# Patient Record
Sex: Female | Born: 1970 | Race: White | Hispanic: No | Marital: Married | Smoking: Current every day smoker
Health system: Southern US, Community
[De-identification: ages and names within clinical notes are randomized; demographics above are authoritative.]

## PROBLEM LIST (undated history)

## (undated) DIAGNOSIS — E119 Type 2 diabetes mellitus without complications: Secondary | ICD-10-CM

## (undated) DIAGNOSIS — I10 Essential (primary) hypertension: Secondary | ICD-10-CM

## (undated) DIAGNOSIS — K589 Irritable bowel syndrome without diarrhea: Secondary | ICD-10-CM

## (undated) DIAGNOSIS — Z85048 Personal history of other malignant neoplasm of rectum, rectosigmoid junction, and anus: Secondary | ICD-10-CM

## (undated) HISTORY — DX: Irritable bowel syndrome, unspecified: K58.9

## (undated) HISTORY — PX: UPPER GASTROINTESTINAL ENDOSCOPY: SHX188

## (undated) HISTORY — DX: Type 2 diabetes mellitus without complications: E11.9

## (undated) HISTORY — DX: Personal history of other malignant neoplasm of rectum, rectosigmoid junction, and anus: Z85.048

---

## 1996-04-08 DIAGNOSIS — E119 Type 2 diabetes mellitus without complications: Secondary | ICD-10-CM

## 1996-04-08 HISTORY — DX: Type 2 diabetes mellitus without complications: E11.9

## 2007-04-09 DIAGNOSIS — C20 Malignant neoplasm of rectum: Secondary | ICD-10-CM

## 2007-04-09 HISTORY — DX: Malignant neoplasm of rectum: C20

## 2008-05-09 HISTORY — PX: OTHER SURGICAL HISTORY: SHX169

## 2015-07-18 DIAGNOSIS — J159 Unspecified bacterial pneumonia: Secondary | ICD-10-CM | POA: Diagnosis not present

## 2015-07-26 DIAGNOSIS — M5090 Cervical disc disorder, unspecified, unspecified cervical region: Secondary | ICD-10-CM | POA: Diagnosis not present

## 2015-08-08 DIAGNOSIS — Z932 Ileostomy status: Secondary | ICD-10-CM | POA: Diagnosis not present

## 2015-08-08 DIAGNOSIS — E109 Type 1 diabetes mellitus without complications: Secondary | ICD-10-CM | POA: Diagnosis not present

## 2015-08-08 DIAGNOSIS — Z794 Long term (current) use of insulin: Secondary | ICD-10-CM | POA: Diagnosis not present

## 2015-11-10 DIAGNOSIS — E1065 Type 1 diabetes mellitus with hyperglycemia: Secondary | ICD-10-CM | POA: Diagnosis not present

## 2015-11-10 DIAGNOSIS — G629 Polyneuropathy, unspecified: Secondary | ICD-10-CM | POA: Diagnosis not present

## 2015-11-10 DIAGNOSIS — Z85038 Personal history of other malignant neoplasm of large intestine: Secondary | ICD-10-CM | POA: Diagnosis not present

## 2016-01-02 DIAGNOSIS — Z85048 Personal history of other malignant neoplasm of rectum, rectosigmoid junction, and anus: Secondary | ICD-10-CM | POA: Diagnosis not present

## 2016-01-02 DIAGNOSIS — K581 Irritable bowel syndrome with constipation: Secondary | ICD-10-CM | POA: Diagnosis not present

## 2016-02-07 DIAGNOSIS — Z932 Ileostomy status: Secondary | ICD-10-CM | POA: Diagnosis not present

## 2016-02-07 DIAGNOSIS — Z794 Long term (current) use of insulin: Secondary | ICD-10-CM | POA: Diagnosis not present

## 2016-02-07 DIAGNOSIS — E109 Type 1 diabetes mellitus without complications: Secondary | ICD-10-CM | POA: Diagnosis not present

## 2016-02-12 DIAGNOSIS — E119 Type 2 diabetes mellitus without complications: Secondary | ICD-10-CM | POA: Diagnosis not present

## 2016-02-12 DIAGNOSIS — Z85048 Personal history of other malignant neoplasm of rectum, rectosigmoid junction, and anus: Secondary | ICD-10-CM | POA: Diagnosis not present

## 2016-02-12 DIAGNOSIS — F17210 Nicotine dependence, cigarettes, uncomplicated: Secondary | ICD-10-CM | POA: Diagnosis not present

## 2016-02-12 DIAGNOSIS — Z980 Intestinal bypass and anastomosis status: Secondary | ICD-10-CM | POA: Diagnosis not present

## 2016-02-12 DIAGNOSIS — Z794 Long term (current) use of insulin: Secondary | ICD-10-CM | POA: Diagnosis not present

## 2016-02-12 DIAGNOSIS — Z1211 Encounter for screening for malignant neoplasm of colon: Secondary | ICD-10-CM | POA: Diagnosis not present

## 2016-02-13 DIAGNOSIS — E119 Type 2 diabetes mellitus without complications: Secondary | ICD-10-CM | POA: Diagnosis not present

## 2016-02-13 DIAGNOSIS — Z980 Intestinal bypass and anastomosis status: Secondary | ICD-10-CM | POA: Diagnosis not present

## 2016-02-13 DIAGNOSIS — F17210 Nicotine dependence, cigarettes, uncomplicated: Secondary | ICD-10-CM | POA: Diagnosis not present

## 2016-02-13 DIAGNOSIS — K581 Irritable bowel syndrome with constipation: Secondary | ICD-10-CM | POA: Diagnosis not present

## 2016-02-13 DIAGNOSIS — Z85048 Personal history of other malignant neoplasm of rectum, rectosigmoid junction, and anus: Secondary | ICD-10-CM | POA: Diagnosis not present

## 2016-02-13 DIAGNOSIS — Z794 Long term (current) use of insulin: Secondary | ICD-10-CM | POA: Diagnosis not present

## 2016-02-13 DIAGNOSIS — K9189 Other postprocedural complications and disorders of digestive system: Secondary | ICD-10-CM | POA: Diagnosis not present

## 2016-02-13 DIAGNOSIS — Z1211 Encounter for screening for malignant neoplasm of colon: Secondary | ICD-10-CM | POA: Diagnosis not present

## 2016-02-13 HISTORY — PX: COLONOSCOPY: SHX174

## 2016-04-09 DIAGNOSIS — B9689 Other specified bacterial agents as the cause of diseases classified elsewhere: Secondary | ICD-10-CM | POA: Diagnosis not present

## 2016-04-09 DIAGNOSIS — Z720 Tobacco use: Secondary | ICD-10-CM | POA: Diagnosis not present

## 2016-04-09 DIAGNOSIS — J208 Acute bronchitis due to other specified organisms: Secondary | ICD-10-CM | POA: Diagnosis not present

## 2016-07-26 DIAGNOSIS — E109 Type 1 diabetes mellitus without complications: Secondary | ICD-10-CM | POA: Diagnosis not present

## 2016-07-26 DIAGNOSIS — Z932 Ileostomy status: Secondary | ICD-10-CM | POA: Diagnosis not present

## 2016-07-26 DIAGNOSIS — Z794 Long term (current) use of insulin: Secondary | ICD-10-CM | POA: Diagnosis not present

## 2016-10-10 DIAGNOSIS — Z1389 Encounter for screening for other disorder: Secondary | ICD-10-CM | POA: Diagnosis not present

## 2016-10-10 DIAGNOSIS — F17200 Nicotine dependence, unspecified, uncomplicated: Secondary | ICD-10-CM | POA: Diagnosis not present

## 2016-10-10 DIAGNOSIS — E1065 Type 1 diabetes mellitus with hyperglycemia: Secondary | ICD-10-CM | POA: Diagnosis not present

## 2016-10-10 DIAGNOSIS — Z6822 Body mass index (BMI) 22.0-22.9, adult: Secondary | ICD-10-CM | POA: Diagnosis not present

## 2017-02-14 DIAGNOSIS — E109 Type 1 diabetes mellitus without complications: Secondary | ICD-10-CM | POA: Diagnosis not present

## 2017-02-14 DIAGNOSIS — Z932 Ileostomy status: Secondary | ICD-10-CM | POA: Diagnosis not present

## 2017-02-14 DIAGNOSIS — Z794 Long term (current) use of insulin: Secondary | ICD-10-CM | POA: Diagnosis not present

## 2017-04-12 DIAGNOSIS — J069 Acute upper respiratory infection, unspecified: Secondary | ICD-10-CM | POA: Diagnosis not present

## 2017-04-12 DIAGNOSIS — R0602 Shortness of breath: Secondary | ICD-10-CM | POA: Diagnosis not present

## 2017-04-12 DIAGNOSIS — R05 Cough: Secondary | ICD-10-CM | POA: Diagnosis not present

## 2017-04-12 DIAGNOSIS — J3489 Other specified disorders of nose and nasal sinuses: Secondary | ICD-10-CM | POA: Diagnosis not present

## 2017-04-12 DIAGNOSIS — F17210 Nicotine dependence, cigarettes, uncomplicated: Secondary | ICD-10-CM | POA: Diagnosis not present

## 2017-04-12 DIAGNOSIS — R0789 Other chest pain: Secondary | ICD-10-CM | POA: Diagnosis not present

## 2017-04-15 DIAGNOSIS — J0110 Acute frontal sinusitis, unspecified: Secondary | ICD-10-CM | POA: Diagnosis not present

## 2017-04-15 DIAGNOSIS — J209 Acute bronchitis, unspecified: Secondary | ICD-10-CM | POA: Diagnosis not present

## 2017-04-15 DIAGNOSIS — R6889 Other general symptoms and signs: Secondary | ICD-10-CM | POA: Diagnosis not present

## 2017-04-15 DIAGNOSIS — Z720 Tobacco use: Secondary | ICD-10-CM | POA: Diagnosis not present

## 2017-05-23 DIAGNOSIS — E1065 Type 1 diabetes mellitus with hyperglycemia: Secondary | ICD-10-CM | POA: Diagnosis not present

## 2017-05-23 DIAGNOSIS — Z720 Tobacco use: Secondary | ICD-10-CM | POA: Diagnosis not present

## 2017-05-23 DIAGNOSIS — Z23 Encounter for immunization: Secondary | ICD-10-CM | POA: Diagnosis not present

## 2017-05-23 DIAGNOSIS — Z6823 Body mass index (BMI) 23.0-23.9, adult: Secondary | ICD-10-CM | POA: Diagnosis not present

## 2017-07-02 DIAGNOSIS — J0100 Acute maxillary sinusitis, unspecified: Secondary | ICD-10-CM | POA: Diagnosis not present

## 2017-07-02 DIAGNOSIS — G43709 Chronic migraine without aura, not intractable, without status migrainosus: Secondary | ICD-10-CM | POA: Diagnosis not present

## 2017-07-02 DIAGNOSIS — Z720 Tobacco use: Secondary | ICD-10-CM | POA: Diagnosis not present

## 2017-07-02 DIAGNOSIS — Z6824 Body mass index (BMI) 24.0-24.9, adult: Secondary | ICD-10-CM | POA: Diagnosis not present

## 2017-08-04 DIAGNOSIS — J329 Chronic sinusitis, unspecified: Secondary | ICD-10-CM | POA: Diagnosis not present

## 2017-08-04 DIAGNOSIS — J40 Bronchitis, not specified as acute or chronic: Secondary | ICD-10-CM | POA: Diagnosis not present

## 2017-08-20 DIAGNOSIS — E109 Type 1 diabetes mellitus without complications: Secondary | ICD-10-CM | POA: Diagnosis not present

## 2017-08-20 DIAGNOSIS — Z794 Long term (current) use of insulin: Secondary | ICD-10-CM | POA: Diagnosis not present

## 2017-08-20 DIAGNOSIS — Z932 Ileostomy status: Secondary | ICD-10-CM | POA: Diagnosis not present

## 2017-11-11 DIAGNOSIS — E109 Type 1 diabetes mellitus without complications: Secondary | ICD-10-CM | POA: Diagnosis not present

## 2017-11-11 DIAGNOSIS — Z794 Long term (current) use of insulin: Secondary | ICD-10-CM | POA: Diagnosis not present

## 2017-11-11 DIAGNOSIS — Z932 Ileostomy status: Secondary | ICD-10-CM | POA: Diagnosis not present

## 2017-11-13 DIAGNOSIS — Z932 Ileostomy status: Secondary | ICD-10-CM | POA: Diagnosis not present

## 2017-11-13 DIAGNOSIS — Z794 Long term (current) use of insulin: Secondary | ICD-10-CM | POA: Diagnosis not present

## 2017-11-13 DIAGNOSIS — E109 Type 1 diabetes mellitus without complications: Secondary | ICD-10-CM | POA: Diagnosis not present

## 2017-12-02 DIAGNOSIS — E109 Type 1 diabetes mellitus without complications: Secondary | ICD-10-CM | POA: Diagnosis not present

## 2017-12-02 DIAGNOSIS — Z794 Long term (current) use of insulin: Secondary | ICD-10-CM | POA: Diagnosis not present

## 2017-12-02 DIAGNOSIS — Z932 Ileostomy status: Secondary | ICD-10-CM | POA: Diagnosis not present

## 2018-01-19 DIAGNOSIS — Z1331 Encounter for screening for depression: Secondary | ICD-10-CM | POA: Diagnosis not present

## 2018-01-19 DIAGNOSIS — F332 Major depressive disorder, recurrent severe without psychotic features: Secondary | ICD-10-CM | POA: Diagnosis not present

## 2018-01-19 DIAGNOSIS — Z1339 Encounter for screening examination for other mental health and behavioral disorders: Secondary | ICD-10-CM | POA: Diagnosis not present

## 2018-01-19 DIAGNOSIS — E119 Type 2 diabetes mellitus without complications: Secondary | ICD-10-CM | POA: Diagnosis not present

## 2018-01-19 DIAGNOSIS — Z23 Encounter for immunization: Secondary | ICD-10-CM | POA: Diagnosis not present

## 2018-02-13 DIAGNOSIS — Z794 Long term (current) use of insulin: Secondary | ICD-10-CM | POA: Diagnosis not present

## 2018-02-13 DIAGNOSIS — Z932 Ileostomy status: Secondary | ICD-10-CM | POA: Diagnosis not present

## 2018-02-13 DIAGNOSIS — E109 Type 1 diabetes mellitus without complications: Secondary | ICD-10-CM | POA: Diagnosis not present

## 2018-05-18 DIAGNOSIS — Z794 Long term (current) use of insulin: Secondary | ICD-10-CM | POA: Diagnosis not present

## 2018-05-18 DIAGNOSIS — E109 Type 1 diabetes mellitus without complications: Secondary | ICD-10-CM | POA: Diagnosis not present

## 2018-05-18 DIAGNOSIS — Z932 Ileostomy status: Secondary | ICD-10-CM | POA: Diagnosis not present

## 2018-07-01 DIAGNOSIS — E1065 Type 1 diabetes mellitus with hyperglycemia: Secondary | ICD-10-CM | POA: Diagnosis not present

## 2018-07-01 DIAGNOSIS — Z6823 Body mass index (BMI) 23.0-23.9, adult: Secondary | ICD-10-CM | POA: Diagnosis not present

## 2018-07-01 DIAGNOSIS — G4700 Insomnia, unspecified: Secondary | ICD-10-CM | POA: Diagnosis not present

## 2018-08-18 DIAGNOSIS — E1065 Type 1 diabetes mellitus with hyperglycemia: Secondary | ICD-10-CM | POA: Diagnosis not present

## 2018-09-22 DIAGNOSIS — E109 Type 1 diabetes mellitus without complications: Secondary | ICD-10-CM | POA: Diagnosis not present

## 2018-11-10 DIAGNOSIS — E1065 Type 1 diabetes mellitus with hyperglycemia: Secondary | ICD-10-CM | POA: Diagnosis not present

## 2019-01-20 DIAGNOSIS — Z23 Encounter for immunization: Secondary | ICD-10-CM | POA: Diagnosis not present

## 2019-02-02 DIAGNOSIS — E1065 Type 1 diabetes mellitus with hyperglycemia: Secondary | ICD-10-CM | POA: Diagnosis not present

## 2019-02-17 DIAGNOSIS — E109 Type 1 diabetes mellitus without complications: Secondary | ICD-10-CM | POA: Diagnosis not present

## 2019-02-17 DIAGNOSIS — Z794 Long term (current) use of insulin: Secondary | ICD-10-CM | POA: Diagnosis not present

## 2019-02-17 DIAGNOSIS — Z932 Ileostomy status: Secondary | ICD-10-CM | POA: Diagnosis not present

## 2019-03-29 ENCOUNTER — Encounter: Payer: Self-pay | Admitting: Gastroenterology

## 2019-04-27 DIAGNOSIS — E1065 Type 1 diabetes mellitus with hyperglycemia: Secondary | ICD-10-CM | POA: Diagnosis not present

## 2019-06-30 DIAGNOSIS — Z6823 Body mass index (BMI) 23.0-23.9, adult: Secondary | ICD-10-CM | POA: Diagnosis not present

## 2019-06-30 DIAGNOSIS — G4700 Insomnia, unspecified: Secondary | ICD-10-CM | POA: Diagnosis not present

## 2019-06-30 DIAGNOSIS — G43909 Migraine, unspecified, not intractable, without status migrainosus: Secondary | ICD-10-CM | POA: Diagnosis not present

## 2019-06-30 DIAGNOSIS — E1065 Type 1 diabetes mellitus with hyperglycemia: Secondary | ICD-10-CM | POA: Diagnosis not present

## 2019-08-09 DIAGNOSIS — E1065 Type 1 diabetes mellitus with hyperglycemia: Secondary | ICD-10-CM | POA: Diagnosis not present

## 2019-08-18 DIAGNOSIS — Z932 Ileostomy status: Secondary | ICD-10-CM | POA: Diagnosis not present

## 2019-08-18 DIAGNOSIS — Z794 Long term (current) use of insulin: Secondary | ICD-10-CM | POA: Diagnosis not present

## 2019-08-18 DIAGNOSIS — E109 Type 1 diabetes mellitus without complications: Secondary | ICD-10-CM | POA: Diagnosis not present

## 2019-11-08 DIAGNOSIS — E1065 Type 1 diabetes mellitus with hyperglycemia: Secondary | ICD-10-CM | POA: Diagnosis not present

## 2019-11-23 DIAGNOSIS — Z932 Ileostomy status: Secondary | ICD-10-CM | POA: Diagnosis not present

## 2019-11-23 DIAGNOSIS — E109 Type 1 diabetes mellitus without complications: Secondary | ICD-10-CM | POA: Diagnosis not present

## 2019-11-23 DIAGNOSIS — Z794 Long term (current) use of insulin: Secondary | ICD-10-CM | POA: Diagnosis not present

## 2020-01-20 DIAGNOSIS — Z6824 Body mass index (BMI) 24.0-24.9, adult: Secondary | ICD-10-CM | POA: Diagnosis not present

## 2020-01-20 DIAGNOSIS — G43909 Migraine, unspecified, not intractable, without status migrainosus: Secondary | ICD-10-CM | POA: Diagnosis not present

## 2020-01-20 DIAGNOSIS — Z23 Encounter for immunization: Secondary | ICD-10-CM | POA: Diagnosis not present

## 2020-01-20 DIAGNOSIS — E1065 Type 1 diabetes mellitus with hyperglycemia: Secondary | ICD-10-CM | POA: Diagnosis not present

## 2020-01-25 DIAGNOSIS — Z794 Long term (current) use of insulin: Secondary | ICD-10-CM | POA: Diagnosis not present

## 2020-01-25 DIAGNOSIS — E109 Type 1 diabetes mellitus without complications: Secondary | ICD-10-CM | POA: Diagnosis not present

## 2020-01-31 DIAGNOSIS — E1065 Type 1 diabetes mellitus with hyperglycemia: Secondary | ICD-10-CM | POA: Diagnosis not present

## 2020-04-24 DIAGNOSIS — E1065 Type 1 diabetes mellitus with hyperglycemia: Secondary | ICD-10-CM | POA: Diagnosis not present

## 2020-05-24 DIAGNOSIS — E109 Type 1 diabetes mellitus without complications: Secondary | ICD-10-CM | POA: Diagnosis not present

## 2020-05-24 DIAGNOSIS — Z794 Long term (current) use of insulin: Secondary | ICD-10-CM | POA: Diagnosis not present

## 2020-05-24 DIAGNOSIS — Z932 Ileostomy status: Secondary | ICD-10-CM | POA: Diagnosis not present

## 2020-07-06 DIAGNOSIS — Z6825 Body mass index (BMI) 25.0-25.9, adult: Secondary | ICD-10-CM | POA: Diagnosis not present

## 2020-07-06 DIAGNOSIS — K589 Irritable bowel syndrome without diarrhea: Secondary | ICD-10-CM | POA: Diagnosis not present

## 2020-07-06 DIAGNOSIS — K625 Hemorrhage of anus and rectum: Secondary | ICD-10-CM | POA: Diagnosis not present

## 2020-07-17 DIAGNOSIS — E1065 Type 1 diabetes mellitus with hyperglycemia: Secondary | ICD-10-CM | POA: Diagnosis not present

## 2020-07-26 DIAGNOSIS — Z6825 Body mass index (BMI) 25.0-25.9, adult: Secondary | ICD-10-CM | POA: Diagnosis not present

## 2020-07-31 ENCOUNTER — Ambulatory Visit (INDEPENDENT_AMBULATORY_CARE_PROVIDER_SITE_OTHER): Payer: BC Managed Care – PPO | Admitting: Gastroenterology

## 2020-07-31 ENCOUNTER — Encounter: Payer: Self-pay | Admitting: Gastroenterology

## 2020-07-31 ENCOUNTER — Other Ambulatory Visit (INDEPENDENT_AMBULATORY_CARE_PROVIDER_SITE_OTHER): Payer: BC Managed Care – PPO

## 2020-07-31 ENCOUNTER — Other Ambulatory Visit: Payer: Self-pay

## 2020-07-31 VITALS — BP 122/74 | HR 88 | Ht 61.0 in | Wt 142.0 lb

## 2020-07-31 DIAGNOSIS — Z85048 Personal history of other malignant neoplasm of rectum, rectosigmoid junction, and anus: Secondary | ICD-10-CM | POA: Diagnosis not present

## 2020-07-31 DIAGNOSIS — K625 Hemorrhage of anus and rectum: Secondary | ICD-10-CM | POA: Diagnosis not present

## 2020-07-31 DIAGNOSIS — K5900 Constipation, unspecified: Secondary | ICD-10-CM | POA: Diagnosis not present

## 2020-07-31 LAB — CBC WITH DIFFERENTIAL/PLATELET
Basophils Absolute: 0.1 10*3/uL (ref 0.0–0.1)
Basophils Relative: 0.9 % (ref 0.0–3.0)
Eosinophils Absolute: 0.2 10*3/uL (ref 0.0–0.7)
Eosinophils Relative: 2.5 % (ref 0.0–5.0)
HCT: 46.4 % — ABNORMAL HIGH (ref 36.0–46.0)
Hemoglobin: 15.8 g/dL — ABNORMAL HIGH (ref 12.0–15.0)
Lymphocytes Relative: 20.5 % (ref 12.0–46.0)
Lymphs Abs: 1.3 10*3/uL (ref 0.7–4.0)
MCHC: 34.1 g/dL (ref 30.0–36.0)
MCV: 92.6 fl (ref 78.0–100.0)
Monocytes Absolute: 0.4 10*3/uL (ref 0.1–1.0)
Monocytes Relative: 6.3 % (ref 3.0–12.0)
Neutro Abs: 4.6 10*3/uL (ref 1.4–7.7)
Neutrophils Relative %: 69.8 % (ref 43.0–77.0)
Platelets: 197 10*3/uL (ref 150.0–400.0)
RBC: 5.01 Mil/uL (ref 3.87–5.11)
RDW: 12.6 % (ref 11.5–15.5)
WBC: 6.5 10*3/uL (ref 4.0–10.5)

## 2020-07-31 LAB — COMPREHENSIVE METABOLIC PANEL
ALT: 19 U/L (ref 0–35)
AST: 20 U/L (ref 0–37)
Albumin: 4 g/dL (ref 3.5–5.2)
Alkaline Phosphatase: 118 U/L — ABNORMAL HIGH (ref 39–117)
BUN: 9 mg/dL (ref 6–23)
CO2: 28 mEq/L (ref 19–32)
Calcium: 9.2 mg/dL (ref 8.4–10.5)
Chloride: 104 mEq/L (ref 96–112)
Creatinine, Ser: 0.77 mg/dL (ref 0.40–1.20)
GFR: 89.49 mL/min (ref >60.00)
Glucose, Bld: 152 mg/dL — ABNORMAL HIGH (ref 70–99)
Potassium: 4.2 mEq/L (ref 3.5–5.1)
Sodium: 137 mEq/L (ref 135–145)
Total Bilirubin: 0.4 mg/dL (ref 0.2–1.2)
Total Protein: 6.5 g/dL (ref 6.0–8.3)

## 2020-07-31 LAB — TSH: TSH: 1.7 u[IU]/mL (ref 0.35–4.50)

## 2020-07-31 MED ORDER — SUTAB 1479-225-188 MG PO TABS: 1.0000 | ORAL_TABLET | ORAL | 0 refills | Status: DC

## 2020-07-31 NOTE — Progress Notes (Signed)
Chief Complaint:   Referring Provider:  Jackquline Denmark, MD      ASSESSMENT AND PLAN;   #1. Rectal bleeding  #2. IBS-C  #3. H/O Rectal Ca (T3N0M0, IIA) s/p neoadjuvant chemo XRT followed by LAR, wedge resection of segment 4 liver nodule (Bx- [facility]) with protective loop ileostomy 05/2008 followed by takedown of loop ileostomy 11/2008. Neg CT chest/Abdo/pelvis 2015  Plan: -CBC, CMP, CEA, TSH, celiac serology -CT AP with p.o. and IV contrast. -Colon with sutab for further evaluation -Colace 1 tab po qd. -Increase water intake. -Discussed in detail with the patient and patient's husband.    Discussed risks & benefits. Risks including rare perforation req laparotomy, bleeding after bx/polypectomy req blood transfusion, rarely missing neoplasms, risks of anesthesia/sedation, rare risk of splenic trauma. Benefits outweigh the risks. Patient agrees to proceed. All the questions were answered. Consent forms given for review.    HPI:    Rhonda Villa is a 50 y.o. female  With IDDM, anxiety/depression, history of smoking, H/O rectal Ca as above Accompanied by her husband  Known to [place] from previous practice.  With intermittent bright red blood per rectum with associated constipation, pellet-like stools.  She also has postprandial abdominal bloating.  She denies having any nausea, vomiting, heartburn, regurgitation, odynophagia or dysphagia.  She denies having any abdominal pain.  No fever chills or night sweats.  No recent weight loss.  Past GI work-up Colonoscopy 02/2016 ([facility]): Anastomosis was visualized 4 cm above the dentate line.  Some degree of anastomotic stenosis.  Pediatric colonoscopy was passed with ease beyond.  Otherwise normal to TI.  Past Medical History:  Diagnosis Date  . Diabetes mellitus ([place])    type 1 on insulin pump  . History of rectal cancer   . IBS (irritable bowel syndrome)     Family History  Problem Relation Age of Onset  . Multiple sclerosis Mother    . Heart failure Father     Social History   Tobacco Use  . Smoking status: Current Every Day Smoker    Types: Cigarettes  . Smokeless tobacco: Never Used  Substance Use Topics  . Alcohol use: Yes    Comment: occasional  . Drug use: Not Currently    Current Outpatient Medications  Medication Sig Dispense Refill  . EMGALITY 120 MG/ML SOAJ Inject 120 mg into the skin once.    . insulin aspart (NOVOLOG) 100 UNIT/ML injection Inject 50 Units into the skin 3 (three) times daily before meals. Via insullin pump    . traZODone (DESYREL) 100 MG tablet Take 200 mg by mouth at bedtime.     No current facility-administered medications for this visit.    No Known Allergies  Review of Systems:  Constitutional: Denies fever, chills, diaphoresis, appetite change and fatigue.  HEENT: Denies photophobia, eye pain, redness, hearing loss, ear pain, congestion, sore throat, rhinorrhea, sneezing, mouth sores, neck pain, neck stiffness and tinnitus.   Respiratory: Denies SOB, DOE, cough, chest tightness,  and wheezing.   Cardiovascular: Denies chest pain, palpitations and leg swelling.  Genitourinary: Denies dysuria, urgency, frequency, hematuria, flank pain and difficulty urinating.  Musculoskeletal: Denies myalgias, back pain, joint swelling, arthralgias and gait problem.  Skin: No rash.  Neurological: Denies dizziness, seizures, syncope, weakness, light-headedness, numbness and headaches.  Hematological: Denies adenopathy. Easy bruising, personal or family bleeding history  Psychiatric/Behavioral: Has anxiety or depression     Physical Exam:    BP 122/74 (BP Location: Left Arm, Patient Position: Sitting, Cuff  Size: Normal)   Pulse 88   Ht 5\' 1"  (1.549 m)   Wt 142 lb (64.4 kg)   BMI 26.83 kg/m  Wt Readings from Last 3 Encounters:  07/31/20 142 lb (64.4 kg)   Constitutional:  Well-developed, in no acute distress. Psychiatric: Normal mood and affect. Behavior is normal. HEENT: Pupils  normal.  Conjunctivae are normal. No scleral icterus. Cardiovascular: Normal rate, regular rhythm. No edema Pulmonary/chest: Effort normal and breath sounds normal. No wheezing, rales or rhonchi. Abdominal: Soft, nondistended. Nontender. Bowel sounds active throughout. There are no masses palpable. No hepatomegaly. Rectal: Deferred Neurological: Alert and oriented to person place and time. Skin: Skin is warm and dry. No rashes noted.    Carmell Austria, MD 07/31/2020, 11:10 AM  Cc: Jackquline Denmark, MD

## 2020-07-31 NOTE — Patient Instructions (Signed)
.  If you are age 51 or older, your body mass index should be between 23-30. Your Body mass index is 26.83 kg/m. If this is out of the aforementioned range listed, please consider follow up with your Primary Care Provider.  If you are age 76 or younger, your body mass index should be between 19-25. Your Body mass index is 26.83 kg/m. If this is out of the aformentioned range listed, please consider follow up with your Primary Care Provider.   Please go to the lab on the 2nd floor [address] before you leave the office today.   You have been [facility] CT [facility] [place] [id number] [address] [facility]   You are scheduled on May 4th  At 930am  . You should arrive 15 minutes prior to your appointment time for registration. Please follow the written instructions below on the day of your exam:  WARNING: IF YOU ARE ALLERGIC TO IODINE/X-RAY DYE, PLEASE NOTIFY RADIOLOGY IMMEDIATELY AT [phone]! YOU WILL BE GIVEN A 13 HOUR PREMEDICATION PREP.  1) Do not eat or drink anything after  530am (4 hours prior to your test) 2) You have been given 2 bottles of oral contrast to drink. The solution may taste better if refrigerated, but do NOT add ice or any other liquid to this solution. Shake well before drinking.    Drink 1 bottle of contrast @ 730am (2 hours prior to your exam)  Drink 1 bottle of contrast @ 830am (1 hour prior to your exam)  You may take any medications as prescribed with a small amount of water, if necessary. If you take any of the following medications: METFORMIN, GLUCOPHAGE, GLUCOVANCE, AVANDAMET, RIOMET, FORTAMET, [place] MET, JANUMET, GLUMETZA or METAGLIP, you MAY be asked to HOLD this medication 48 hours AFTER the exam.  The purpose of you drinking the oral contrast is to aid in the visualization of your intestinal tract. The contrast solution may cause some diarrhea. Depending on your individual set of  symptoms, you may also receive an intravenous injection of x-ray contrast/dye. Plan on being at [facility] for 30 minutes or longer, depending on the type of exam you are having performed.  This test typically takes 30-45 minutes to complete.  If you have any questions regarding your exam or if you need to reschedule, you may call the CT department at [phone] between the hours of 8:00 am and 5:00 pm, Monday-Friday.  ________________________________________________________________________

## 2020-08-01 LAB — CEA: CEA: 5.6 ng/mL — ABNORMAL HIGH

## 2020-08-02 ENCOUNTER — Other Ambulatory Visit: Payer: Self-pay

## 2020-08-02 DIAGNOSIS — R768 Other specified abnormal immunological findings in serum: Secondary | ICD-10-CM

## 2020-08-02 LAB — CELIAC PANEL 10
Antigliadin Abs, IgA: 101 units — ABNORMAL HIGH (ref 0–19)
Endomysial IgA: POSITIVE — AB
Gliadin IgG: 45 units — ABNORMAL HIGH (ref 0–19)
IgA/Immunoglobulin A, Serum: 107 mg/dL (ref 87–352)
Tissue Transglut Ab: <2 U/mL (ref 0–5)
Transglutaminase IgA: 27 U/mL — ABNORMAL HIGH (ref 0–3)

## 2020-08-02 NOTE — Progress Notes (Signed)
Please inform the patient. Fascinating-she has positive celiac screen and likely has celiac disease in addition.  Also has mildly elevated CEA  Vaughan Basta, -Add EGD with small bowel biopsies.  To be done with colonoscopy. -Send report to family physician -She is scheduled for CT Abdo/pelvis already

## 2020-08-09 ENCOUNTER — Ambulatory Visit (HOSPITAL_BASED_OUTPATIENT_CLINIC_OR_DEPARTMENT_OTHER)
Admission: RE | Admit: 2020-08-09 | Discharge: 2020-08-09 | Disposition: A | Discharge status: Home / Self Care | Payer: BC Managed Care – PPO | Source: Ambulatory Visit | Attending: Gastroenterology | Admitting: Gastroenterology

## 2020-08-09 ENCOUNTER — Encounter (HOSPITAL_BASED_OUTPATIENT_CLINIC_OR_DEPARTMENT_OTHER): Payer: Self-pay

## 2020-08-09 ENCOUNTER — Other Ambulatory Visit: Payer: Self-pay

## 2020-08-09 DIAGNOSIS — K6389 Other specified diseases of intestine: Secondary | ICD-10-CM | POA: Diagnosis not present

## 2020-08-09 DIAGNOSIS — C20 Malignant neoplasm of rectum: Secondary | ICD-10-CM | POA: Diagnosis not present

## 2020-08-09 DIAGNOSIS — K5900 Constipation, unspecified: Secondary | ICD-10-CM | POA: Diagnosis not present

## 2020-08-09 DIAGNOSIS — Z85048 Personal history of other malignant neoplasm of rectum, rectosigmoid junction, and anus: Secondary | ICD-10-CM | POA: Insufficient documentation

## 2020-08-09 DIAGNOSIS — K625 Hemorrhage of anus and rectum: Secondary | ICD-10-CM | POA: Diagnosis not present

## 2020-08-09 DIAGNOSIS — K7689 Other specified diseases of liver: Secondary | ICD-10-CM | POA: Diagnosis not present

## 2020-08-09 MED ORDER — IOHEXOL 300 MG/ML  SOLN
100.0000 mL | Freq: Once | INTRAMUSCULAR | Status: AC | PRN
  Administered 2020-08-09: 100 mL via INTRAVENOUS

## 2020-08-11 ENCOUNTER — Encounter: Payer: Self-pay | Admitting: Gastroenterology

## 2020-08-17 ENCOUNTER — Other Ambulatory Visit: Payer: Self-pay

## 2020-08-17 ENCOUNTER — Encounter: Payer: Self-pay | Admitting: Gastroenterology

## 2020-08-17 ENCOUNTER — Ambulatory Visit (AMBULATORY_SURGERY_CENTER): Payer: BC Managed Care – PPO | Admitting: Gastroenterology

## 2020-08-17 VITALS — BP 122/64 | HR 79 | Temp 97.8°F | Resp 14 | Ht 61.0 in | Wt 142.0 lb

## 2020-08-17 DIAGNOSIS — K922 Gastrointestinal hemorrhage, unspecified: Secondary | ICD-10-CM | POA: Diagnosis not present

## 2020-08-17 DIAGNOSIS — D123 Benign neoplasm of transverse colon: Secondary | ICD-10-CM

## 2020-08-17 DIAGNOSIS — R768 Other specified abnormal immunological findings in serum: Secondary | ICD-10-CM | POA: Diagnosis not present

## 2020-08-17 DIAGNOSIS — K648 Other hemorrhoids: Secondary | ICD-10-CM | POA: Diagnosis not present

## 2020-08-17 DIAGNOSIS — K900 Celiac disease: Secondary | ICD-10-CM

## 2020-08-17 DIAGNOSIS — K582 Mixed irritable bowel syndrome: Secondary | ICD-10-CM | POA: Diagnosis not present

## 2020-08-17 DIAGNOSIS — K625 Hemorrhage of anus and rectum: Secondary | ICD-10-CM | POA: Diagnosis not present

## 2020-08-17 DIAGNOSIS — K319 Disease of stomach and duodenum, unspecified: Secondary | ICD-10-CM

## 2020-08-17 DIAGNOSIS — D122 Benign neoplasm of ascending colon: Secondary | ICD-10-CM

## 2020-08-17 DIAGNOSIS — K588 Other irritable bowel syndrome: Secondary | ICD-10-CM

## 2020-08-17 DIAGNOSIS — K5730 Diverticulosis of large intestine without perforation or abscess without bleeding: Secondary | ICD-10-CM | POA: Diagnosis not present

## 2020-08-17 DIAGNOSIS — K449 Diaphragmatic hernia without obstruction or gangrene: Secondary | ICD-10-CM

## 2020-08-17 DIAGNOSIS — K5900 Constipation, unspecified: Secondary | ICD-10-CM

## 2020-08-17 DIAGNOSIS — R197 Diarrhea, unspecified: Secondary | ICD-10-CM

## 2020-08-17 DIAGNOSIS — Z85048 Personal history of other malignant neoplasm of rectum, rectosigmoid junction, and anus: Secondary | ICD-10-CM

## 2020-08-17 DIAGNOSIS — K2970 Gastritis, unspecified, without bleeding: Secondary | ICD-10-CM | POA: Diagnosis not present

## 2020-08-17 HISTORY — PX: COLONOSCOPY: SHX174

## 2020-08-17 MED ORDER — SODIUM CHLORIDE 0.9 % IV SOLN
4.0000 mg | Freq: Once | INTRAVENOUS | Status: AC
  Administered 2020-08-17: 4 mg via INTRAVENOUS

## 2020-08-17 MED ORDER — SODIUM CHLORIDE 0.9 % IV SOLN: 500.0000 mL | Freq: Once | INTRAVENOUS | Status: DC

## 2020-08-17 MED ORDER — PANTOPRAZOLE SODIUM 40 MG PO TBEC: 40.0000 mg | DELAYED_RELEASE_TABLET | Freq: Every day | ORAL | 11 refills | Status: DC

## 2020-08-17 NOTE — Progress Notes (Signed)
Pt's states no medical or surgical changes since previsit or office visit.  [facility] [place]   Pt c/o nausea, vomited with sutabs this am, zofran 4 mg IVP given at 1040 am

## 2020-08-17 NOTE — Op Note (Signed)
[facility] Patient Name: Rhonda Villa Procedure Date: 08/17/2020 10:53 AM MRN: [id number] Endoscopist: Jackquline Denmark , MD Age: 50 Referring MD:  Date of Birth: [birth year] Gender: Female Account #: [id number] Procedure:                Colonoscopy Indications:              #1. Rectal bleeding                           #2. IBS-C                           #3. H/O Rectal Ca (T3N0M0, IIA) s/p neoadjuvant                            chemo XRT followed by LAR, wedge resection of                            segment 4 liver nodule (Bx- [facility]) with protective                            loop ileostomy 05/2008 followed by takedown of loop                            ileostomy 11/2008. Neg CT chest/Abdo/pelvis 2015 Medicines:                Monitored Anesthesia Care Procedure:                Pre-Anesthesia Assessment:                           - Prior to the procedure, a History and Physical                            was performed, and patient medications and                            allergies were reviewed. The patient's tolerance of                            previous anesthesia was also reviewed. The risks                            and benefits of the procedure and the sedation                            options and risks were discussed with the patient.                            All questions were answered, and informed consent                            was obtained. Prior Anticoagulants: The patient has  taken no previous anticoagulant or antiplatelet                            agents. ASA Grade Assessment: II - A patient with                            mild systemic disease. After reviewing the risks                            and benefits, the patient was deemed in                            satisfactory condition to undergo the procedure.                           After obtaining informed consent, the colonoscope                            was passed under  direct vision. Throughout the                            procedure, the patient's blood pressure, pulse, and                            oxygen saturations were monitored continuously. The                            Olympus PFC-H190DL (#[id number]) Colonoscope was                            introduced through the anus and advanced to the 2                            cm into the ileum. The colonoscopy was performed                            without difficulty. The patient tolerated the                            procedure well. The quality of the bowel                            preparation was adequate to identify polyps. The                            terminal ileum, ileocecal valve, appendiceal                            orifice, and rectum were photographed. Scope In: 11:11:42 AM Scope Out: 11:25:12 AM Scope Withdrawal Time: 0 hours 11 minutes 48 seconds  Total Procedure Duration: 0 hours 13 minutes 30 seconds  Findings:                 A 6 mm polyp was found in the mid ascending  colon.                            The polyp was sessile. The polyp was removed with a                            cold snare. Resection and retrieval were complete.                           Two sessile polyps were found in the proximal                            transverse colon and proximal ascending colon. The                            polyps were 2 to 4 mm in size. These polyps were                            removed with a cold biopsy forceps. Resection and                            retrieval were complete.                           A few small-mouthed diverticula were found in the                            sigmoid colon.                           There was evidence of a prior end-to-end                            colo-colonic anastomosis in the rectum s/o LAR, 5                            cm from the dentate line with minor anastomotic                            stenosis. This was patent and was  characterized by                            healthy appearing mucosa. The anastomosis was                            traversed with PCF H1 90 scope.                           Non-bleeding internal hemorrhoids were found during                            retroflexion. The hemorrhoids were small.  The terminal ileum appeared normal.                           The exam was otherwise without abnormality on                            direct and retroflexion views. Complications:            No immediate complications. Estimated Blood Loss:     Estimated blood loss: none. Impression:               - Colonic polyps s/p polypectomy.                           - Mild sigmoid diverticulosis                           - Patent end-to-end colo-colonic anastomosis,                            characterized by healthy appearing mucosa with                            minor degree of anastomotic stenosis. No recurrence.                           - Non-bleeding internal hemorrhoids.                           - The examined portion of the ileum was normal.                           - The examination was otherwise normal on direct                            and retroflexion views. Recommendation:           - Patient has a contact number available for                            emergencies. The signs and symptoms of potential                            delayed complications were discussed with the                            patient. Return to normal activities tomorrow.                            Written discharge instructions were provided to the                            patient.                           - Resume previous diet.                           -  Continue present medications.                           - Await pathology results.                           - Repeat colonoscopy for surveillance based on                            pathology results.                           -  The findings and recommendations were discussed                            with the patient's family. Jackquline Denmark, MD 08/17/2020 11:45:28 AM This report has been signed electronically.

## 2020-08-17 NOTE — Progress Notes (Signed)
Called to room to assist during endoscopic procedure.  Patient ID and intended procedure confirmed with present staff. Received instructions for my participation in the procedure from the performing physician.  

## 2020-08-17 NOTE — Op Note (Signed)
[facility] Patient Name: Rhonda Villa Procedure Date: 08/17/2020 10:54 AM MRN: [id number] Endoscopist: Jackquline Denmark , MD Age: 50 Referring MD:  Date of Birth: 1970-09-28 Gender: Female Account #: [id number] Procedure:                Upper GI endoscopy Indications:              Positive celiac screen. Medicines:                Monitored Anesthesia Care Procedure:                Pre-Anesthesia Assessment:                           - Prior to the procedure, a History and Physical                            was performed, and patient medications and                            allergies were reviewed. The patient's tolerance of                            previous anesthesia was also reviewed. The risks                            and benefits of the procedure and the sedation                            options and risks were discussed with the patient.                            All questions were answered, and informed consent                            was obtained. Prior Anticoagulants: The patient has                            taken no previous anticoagulant or antiplatelet                            agents. ASA Grade Assessment: II - A patient with                            mild systemic disease. After reviewing the risks                            and benefits, the patient was deemed in                            satisfactory condition to undergo the procedure.                           After obtaining informed consent, the endoscope was  passed under direct vision. Throughout the                            procedure, the patient's blood pressure, pulse, and                            oxygen saturations were monitored continuously. The                            Endoscope was introduced through the mouth, and                            advanced to the second part of duodenum. The upper                            GI endoscopy was accomplished  without difficulty.                            The patient tolerated the procedure well. Scope In: Scope Out: Findings:                 The examined esophagus was normal with well-defined                            Z-line at 38 cm.                           A small 2 cm hiatal hernia was present.                           Localized moderate inflammation characterized by                            erythema with some dark adherent material, was                            found in the gastric body. Biopsies were taken with                            a cold forceps for histology.                           Diffuse moderately scalloped mucosa was found in                            the duodenal bulb, in the first portion of the                            duodenum and in the second portion of the duodenum.                            Biopsies were taken with a cold forceps for  histology. Complications:            No immediate complications. Estimated Blood Loss:     Estimated blood loss: none. Impression:               - Small hiatal hernia.                           - Gastritis. Biopsied.                           - Scalloped mucosa was found in the duodenum,                            suspicious for celiac disease. Biopsied. Recommendation:           - Patient has a contact number available for                            emergencies. The signs and symptoms of potential                            delayed complications were discussed with the                            patient. Return to normal activities tomorrow.                            Written discharge instructions were provided to the                            patient.                           - Resume previous diet.                           - Continue present medications.                           - Await pathology results.                           - Start Protonix 40 mg p.o. once a day #30, 11                             refills                           - No aspirin, ibuprofen, naproxen, or other                            non-steroidal anti-inflammatory drugs.                           - The findings and recommendations were discussed  with the patient's family. Jackquline Denmark, MD 08/17/2020 11:33:04 AM This report has been signed electronically.

## 2020-08-17 NOTE — Patient Instructions (Addendum)
NO ASPIRIN, ASPIRIN CONTAINING PRODUCTS (BC OR GOODY POWDERS) OR NSAIDS (Naproxen, IBUPROFEN, ADVIL, ALEVE, AND MOTRIN) ; TYLENOL IS OK TO TAKE   Protonix 40 mg once daily ( 30 minutes before 1st meal of the day) - sent to your pharmacy   Await pathology results on polyps removed from colon and biopsies done in stomach   Colon: Handouts on polyps ,diverticulosis,& hemorrhoids given to you today  Upper Endoscopy: Handout on gastritis given to you today    YOU HAD AN ENDOSCOPIC PROCEDURE TODAY AT [facility]:   Refer to the procedure report that was given to you for any specific questions about what was found during the examination.  If the procedure report does not answer your questions, please call your gastroenterologist to clarify.  If you requested that your care partner not be given the details of your procedure findings, then the procedure report has been included in a sealed envelope for you to review at your convenience later.  YOU SHOULD EXPECT: Some feelings of bloating in the abdomen. Passage of more gas than usual.  Walking can help get rid of the air that was put into your GI tract during the procedure and reduce the bloating. If you had a lower endoscopy (such as a colonoscopy or flexible sigmoidoscopy) you may notice spotting of blood in your stool or on the toilet paper. If you underwent a bowel prep for your procedure, you may not have a normal bowel movement for a few days.  Please Note:  You might notice some irritation and congestion in your nose or some drainage.  This is from the oxygen used during your procedure.  There is no need for concern and it should clear up in a day or so.  SYMPTOMS TO REPORT IMMEDIATELY:   Following lower endoscopy (colonoscopy or flexible sigmoidoscopy):  Excessive amounts of blood in the stool  Significant tenderness or worsening of abdominal pains  Swelling of the abdomen that is new, acute  Fever of 100F or  higher   Following upper endoscopy (EGD)  Vomiting of blood or coffee ground material  New chest pain or pain under the shoulder blades  Painful or persistently difficult swallowing  New shortness of breath  Fever of 100F or higher  Black, tarry-looking stools  For urgent or emergent issues, a gastroenterologist can be reached at any hour by calling [phone]. Do not use MyChart messaging for urgent concerns.    DIET:  We do recommend a small meal at first, but then you may proceed to your regular diet.  Drink plenty of fluids but you should avoid alcoholic beverages for 24 hours.  ACTIVITY:  You should plan to take it easy for the rest of today and you should NOT DRIVE or use heavy machinery until tomorrow (because of the sedation medicines used during the test).    FOLLOW UP: Our staff will call the number listed on your records 48-72 hours following your procedure to check on you and address any questions or concerns that you may have regarding the information given to you following your procedure. If we do not reach you, we will leave a message.  We will attempt to reach you two times.  During this call, we will ask if you have developed any symptoms of COVID 19. If you develop any symptoms (ie: fever, flu-like symptoms, shortness of breath, cough etc.) before then, please call [phone].  If you test positive for Covid 19 in the 2  weeks post procedure, please call and report this information to [place].    If any biopsies were taken you will be contacted by phone or by letter within the next 1-3 weeks.  Please call us at [phone] if you have not heard about the biopsies in 3 weeks.    SIGNATURES/CONFIDENTIALITY: You and/or your care partner have signed paperwork which will be entered into your electronic medical record.  These signatures attest to the fact that that the information above on your After Visit Summary has been reviewed and is understood.  Full responsibility of  the confidentiality of this discharge information lies with you and/or your care-partner.   Gluten-Free Diet for Celiac Disease, Adult  The gluten-free diet includes all foods that do not contain gluten. Gluten is a protein that is found in wheat, rye, barley, and some other grains. Following the gluten-free diet is the only treatment for people with celiac disease. It helps to prevent damage to the intestines and improves or eliminates the symptoms of celiac disease. Following the gluten-free diet requires some planning. It can be challenging at first, but it gets easier with time and practice. There are more gluten-free options available today than ever before. If you need help finding gluten-free foods or if you have questions, talk with your dietitian or your health care provider. What are tips for following this plan? Reading food labels Read all food labels. Gluten is often added to foods. Always check the ingredient list and look for warnings, such as "may contain gluten." Foods that list any of these key words on the label usually contain gluten:  Wheat, flour, enriched flour, bromated flour, white flour, durum flour, graham flour, phosphated flour, self-rising flour, semolina, farina, barley (malt), rye, and oats.  Starch, dextrin, modified food starch, or cereal.  Thickening, fillers, or emulsifiers.  Malt flavoring, malt extract, or malt syrup.  Hydrolyzed vegetable protein. In the [place] packaged foods that are gluten-free are required to be labeled "GF." These foods should be easy to identify and are safe to eat. In the [place] food companies are also required to list common food allergens, including wheat, on their labels. Shopping When grocery shopping, start by shopping in the produce, meat, and dairy sections. These sections are more likely to contain gluten-free foods. Then move to the aisles that contain packaged foods if you need to. Meal planning  All fruits, vegetables,  and meats are safe to eat and do not contain gluten.  Talk with your dietitian or health care provider before taking a gluten-free multivitamin or mineral supplement.  Be aware of gluten-free foods having contact with foods that contain gluten (cross-contamination). This can happen at home and with any processed foods. ? Talk with your health care provider or dietitian about how to reduce the risk of cross-contamination in your home. ? If you have questions about how a food is processed, ask the manufacturer. What foods can I eat? Fruits All plain fresh, frozen, canned, and dried fruits, and 100% fruit juices. Vegetables All plain fresh, frozen, and canned vegetables. Grains Amaranth, bean flours, 100% buckwheat flour, corn, millet, nut flours or nut meals, GF oats, quinoa, rice, sorghum, teff, rice wafers, pure cornmeal tortillas, popcorn, and hot cereals made from cornmeal.  Hominy, rice, wild rice. Some Asian rice noodles or bean noodles.  Arrowroot starch, corn bran, corn flour, corn germ, cornmeal, corn starch, potato flour, potato starch flour, and rice bran. Plain, brown, and sweet rice flours. Rice polish, soy flour, and  tapioca starch. Meats and other protein foods All fresh beef, pork, poultry, fish, seafood, and eggs. Fish canned in water, oil, brine, or vegetable broth. Plain nuts and seeds, peanut butter.  Some precooked or cured meat, such as sausages or meat loaves. Some frankfurters. Dried beans, dried peas, and lentils. Dairy Fresh plain, dry, evaporated, or condensed milk. Cream, butter, sour cream, whipping cream, and most yogurts. Unprocessed cheese, most processed cheeses, some cottage cheeses, some cream cheeses. Beverages Coffee, tea, most herbal teas. Carbonated beverages and some root beers. Wine, sake, and distilled spirits, such as gin, vodka, and whiskey. Most hard ciders. Fats and oils Butter, margarine, vegetable oil, hydrogenated butter, olive oil, shortening,  lard, cream, and some mayonnaise. Some commercial salad dressings. Olives. Sweets and desserts Sugar, honey, some syrups, molasses, jelly, and jam. Plain hard candy, marshmallows, and gumdrops.  Pure cocoa powder. Plain chocolate. Custard and some pudding mixes. Gelatin desserts, sorbets, frozen ice pops, and sherbet.  Cake, cookies, and other desserts prepared with allowed flours. Some commercial ice creams. Cornstarch, tapioca, and rice puddings. Seasoning and other foods Some canned or frozen soups. Monosodium glutamate (MSG). Cider, rice, and wine vinegar. Baking soda and baking powder.  Cream of tartar. Baking and nutritional yeast. Certain soy sauces made without wheat (ask your dietitian about specific brands that are allowed).  Nuts, coconut, and chocolate. Salt, pepper, herbs, spices, flavoring extracts, imitation or artificial flavorings, natural flavorings, and food colorings.  Some medicines and supplements. Rice syrups. The items listed above may not be a complete list of foods and beverages you can eat. Contact a dietitian for more information. What foods should I avoid? Fruits Thickened or prepared fruits and some pie fillings. Some fruit snacks and fruit roll-ups. Vegetables Most creamed vegetables and most vegetables canned in sauces. Some commercially prepared vegetables and salads. Vegetables in a soy sauce marinade or dressing. Grains Barley, bran, bulgur, couscous, cracked wheat, [place], farro, graham, malt, matzo, semolina, wheat germ, and all wheat and rye cereals including spelt and kamut.  Cereals containing malt as a flavoring, such as rice cereal. Noodles, spaghetti, macaroni, most packaged rice mixes, and all mixes containing wheat, rye, barley, or triticale. Meats and other protein foods Any meat or meat alternative containing wheat, rye, barley, or gluten stabilizers. These are often marinated or packaged meats, and precooked or cured meat, such as sausages or meat  loaves. Bread-containing products, such as Swiss steak, croquettes, meatballs, and meatloaf. Most tuna canned in vegetable broth and [place] with hydrolyzed vegetable protein (HVP) injected as part of the basting. Seitan. Imitation fish. Eggs in sauces made from ingredients to avoid. Dairy Commercial chocolate milk drinks and malted milk. Some non-dairy creamers. Any cheese product containing ingredients to avoid. Beverages Certain cereal beverages. Beer, ale, malted milk, and some root beers. Some hard ciders. Some instant flavored coffees. Some herbal teas made with barley or with barley malt added. Fats and oils Some commercial salad dressings. Sour cream containing modified food starch. Sweets and desserts Some toffees. Chocolate-coated nuts (may be rolled in wheat flour) and some commercial candies and candy bars.  Most cakes, cookies, donuts, pastries, and other baked goods. Some commercial ice cream. Ice cream cones.  Commercially prepared mixes for cakes, cookies, and other desserts. Bread pudding and other puddings thickened with flour.  Products containing brown rice syrup made with barley malt enzyme. Desserts and sweets made with malt flavoring. Seasoning and other foods Some curry powders, some dry seasoning mixes, some gravy extracts, some meat  sauces, some ketchups, some prepared mustards, and horseradish.  Certain soy sauces. Malt vinegar. Bouillon and bouillon cubes that contain HVP. Some chip dips, and some chewing gum.  Yeast extract. Brewer's yeast. Caramel color.  Some medicines and supplements. The items listed above may not be a complete list of foods and beverages you should avoid. Contact a dietitian for more information. Summary  Gluten is a protein that is found in wheat, rye, barley, and some other grains. The gluten-free diet includes all foods that do not contain gluten.  If you need help finding gluten-free foods or if you have questions, talk with your dietitian  or your health care provider.  Read all food labels. Gluten is often added to foods. Always check the ingredient list and look for warnings, such as "may contain gluten." This information is not intended to replace advice given to you by your health care provider. Make sure you discuss any questions you have with your health care provider. Document Revised: 03/29/2019 Document Reviewed: 03/29/2019 Elsevier Patient Education  2021 [place].  Gluten free diet attached to D/C instructions per Dr Steve Rattler order

## 2020-08-17 NOTE — Progress Notes (Signed)
To PACU, VSS. Report to [email or web address] 

## 2020-08-21 ENCOUNTER — Telehealth: Payer: Self-pay

## 2020-08-21 DIAGNOSIS — Z932 Ileostomy status: Secondary | ICD-10-CM | POA: Diagnosis not present

## 2020-08-21 DIAGNOSIS — E109 Type 1 diabetes mellitus without complications: Secondary | ICD-10-CM | POA: Diagnosis not present

## 2020-08-21 DIAGNOSIS — Z794 Long term (current) use of insulin: Secondary | ICD-10-CM | POA: Diagnosis not present

## 2020-08-21 NOTE — Telephone Encounter (Signed)
LVM

## 2020-08-21 NOTE — Telephone Encounter (Signed)
Left message on follow up call. 

## 2020-09-07 ENCOUNTER — Encounter: Payer: Self-pay | Admitting: Gastroenterology

## 2020-09-07 DIAGNOSIS — E109 Type 1 diabetes mellitus without complications: Secondary | ICD-10-CM | POA: Diagnosis not present

## 2020-10-10 DIAGNOSIS — E1065 Type 1 diabetes mellitus with hyperglycemia: Secondary | ICD-10-CM | POA: Diagnosis not present

## 2020-11-17 DIAGNOSIS — G43909 Migraine, unspecified, not intractable, without status migrainosus: Secondary | ICD-10-CM | POA: Diagnosis not present

## 2020-11-17 DIAGNOSIS — Z6825 Body mass index (BMI) 25.0-25.9, adult: Secondary | ICD-10-CM | POA: Diagnosis not present

## 2020-11-17 DIAGNOSIS — E109 Type 1 diabetes mellitus without complications: Secondary | ICD-10-CM | POA: Diagnosis not present

## 2020-11-17 DIAGNOSIS — D510 Vitamin B12 deficiency anemia due to intrinsic factor deficiency: Secondary | ICD-10-CM | POA: Diagnosis not present

## 2020-11-21 DIAGNOSIS — E109 Type 1 diabetes mellitus without complications: Secondary | ICD-10-CM | POA: Diagnosis not present

## 2020-11-21 DIAGNOSIS — Z794 Long term (current) use of insulin: Secondary | ICD-10-CM | POA: Diagnosis not present

## 2020-11-21 DIAGNOSIS — Z932 Ileostomy status: Secondary | ICD-10-CM | POA: Diagnosis not present

## 2021-01-26 DIAGNOSIS — Z23 Encounter for immunization: Secondary | ICD-10-CM | POA: Diagnosis not present

## 2021-04-13 DIAGNOSIS — G43909 Migraine, unspecified, not intractable, without status migrainosus: Secondary | ICD-10-CM | POA: Diagnosis not present

## 2021-04-13 DIAGNOSIS — E109 Type 1 diabetes mellitus without complications: Secondary | ICD-10-CM | POA: Diagnosis not present

## 2021-04-13 DIAGNOSIS — G629 Polyneuropathy, unspecified: Secondary | ICD-10-CM | POA: Diagnosis not present

## 2021-04-13 DIAGNOSIS — Z6825 Body mass index (BMI) 25.0-25.9, adult: Secondary | ICD-10-CM | POA: Diagnosis not present

## 2021-05-16 ENCOUNTER — Other Ambulatory Visit: Payer: Self-pay | Admitting: Gastroenterology

## 2021-06-20 DIAGNOSIS — I10 Essential (primary) hypertension: Secondary | ICD-10-CM | POA: Diagnosis not present

## 2021-06-20 DIAGNOSIS — Z6824 Body mass index (BMI) 24.0-24.9, adult: Secondary | ICD-10-CM | POA: Diagnosis not present

## 2021-07-12 DIAGNOSIS — G43909 Migraine, unspecified, not intractable, without status migrainosus: Secondary | ICD-10-CM | POA: Diagnosis not present

## 2021-07-12 DIAGNOSIS — E109 Type 1 diabetes mellitus without complications: Secondary | ICD-10-CM | POA: Diagnosis not present

## 2021-07-12 DIAGNOSIS — Z85040 Personal history of malignant carcinoid tumor of rectum: Secondary | ICD-10-CM | POA: Diagnosis not present

## 2021-07-12 DIAGNOSIS — D510 Vitamin B12 deficiency anemia due to intrinsic factor deficiency: Secondary | ICD-10-CM | POA: Diagnosis not present

## 2021-07-31 DIAGNOSIS — F410 Panic disorder [episodic paroxysmal anxiety] without agoraphobia: Secondary | ICD-10-CM | POA: Diagnosis not present

## 2021-07-31 DIAGNOSIS — Z6823 Body mass index (BMI) 23.0-23.9, adult: Secondary | ICD-10-CM | POA: Diagnosis not present

## 2021-07-31 DIAGNOSIS — I10 Essential (primary) hypertension: Secondary | ICD-10-CM | POA: Diagnosis not present

## 2021-10-01 DIAGNOSIS — I10 Essential (primary) hypertension: Secondary | ICD-10-CM | POA: Diagnosis not present

## 2021-10-01 DIAGNOSIS — Z6822 Body mass index (BMI) 22.0-22.9, adult: Secondary | ICD-10-CM | POA: Diagnosis not present

## 2022-01-28 DIAGNOSIS — Z23 Encounter for immunization: Secondary | ICD-10-CM | POA: Diagnosis not present

## 2022-02-18 DIAGNOSIS — E109 Type 1 diabetes mellitus without complications: Secondary | ICD-10-CM | POA: Diagnosis not present

## 2022-02-18 DIAGNOSIS — D510 Vitamin B12 deficiency anemia due to intrinsic factor deficiency: Secondary | ICD-10-CM | POA: Diagnosis not present

## 2022-02-18 DIAGNOSIS — E559 Vitamin D deficiency, unspecified: Secondary | ICD-10-CM | POA: Diagnosis not present

## 2022-03-05 DIAGNOSIS — Z932 Ileostomy status: Secondary | ICD-10-CM | POA: Diagnosis not present

## 2022-03-05 DIAGNOSIS — E109 Type 1 diabetes mellitus without complications: Secondary | ICD-10-CM | POA: Diagnosis not present

## 2022-03-05 DIAGNOSIS — Z794 Long term (current) use of insulin: Secondary | ICD-10-CM | POA: Diagnosis not present

## 2022-07-08 ENCOUNTER — Other Ambulatory Visit: Payer: Self-pay | Admitting: Gastroenterology

## 2022-07-19 DIAGNOSIS — Z6824 Body mass index (BMI) 24.0-24.9, adult: Secondary | ICD-10-CM | POA: Diagnosis not present

## 2022-07-19 DIAGNOSIS — M2550 Pain in unspecified joint: Secondary | ICD-10-CM | POA: Diagnosis not present

## 2022-07-19 DIAGNOSIS — M65311 Trigger thumb, right thumb: Secondary | ICD-10-CM | POA: Diagnosis not present

## 2022-07-30 DIAGNOSIS — M65311 Trigger thumb, right thumb: Secondary | ICD-10-CM | POA: Diagnosis not present

## 2022-10-07 DIAGNOSIS — Z6824 Body mass index (BMI) 24.0-24.9, adult: Secondary | ICD-10-CM | POA: Diagnosis not present

## 2022-10-07 DIAGNOSIS — M542 Cervicalgia: Secondary | ICD-10-CM | POA: Diagnosis not present

## 2022-10-11 DIAGNOSIS — E109 Type 1 diabetes mellitus without complications: Secondary | ICD-10-CM | POA: Diagnosis not present

## 2022-10-11 DIAGNOSIS — Z85040 Personal history of malignant carcinoid tumor of rectum: Secondary | ICD-10-CM | POA: Diagnosis not present

## 2022-10-11 DIAGNOSIS — E559 Vitamin D deficiency, unspecified: Secondary | ICD-10-CM | POA: Diagnosis not present

## 2022-10-11 DIAGNOSIS — R5383 Other fatigue: Secondary | ICD-10-CM | POA: Diagnosis not present

## 2022-11-09 ENCOUNTER — Other Ambulatory Visit: Payer: Self-pay | Admitting: Gastroenterology

## 2022-11-25 ENCOUNTER — Encounter: Payer: Self-pay | Admitting: Nurse Practitioner

## 2022-11-25 ENCOUNTER — Ambulatory Visit (INDEPENDENT_AMBULATORY_CARE_PROVIDER_SITE_OTHER): Payer: BC Managed Care – PPO | Admitting: Nurse Practitioner

## 2022-11-25 VITALS — BP 96/50 | HR 72 | Ht 61.0 in | Wt 136.0 lb

## 2022-11-25 DIAGNOSIS — K219 Gastro-esophageal reflux disease without esophagitis: Secondary | ICD-10-CM

## 2022-11-25 MED ORDER — PANTOPRAZOLE SODIUM 40 MG PO TBEC: 40.0000 mg | DELAYED_RELEASE_TABLET | Freq: Every day | ORAL | 4 refills | Status: DC

## 2022-11-25 NOTE — Progress Notes (Signed)
Primary GI: Rhonda Bologna, MD   ASSESSMENT & PLAN   52 year old female with a history of IBS-C, Rectal Ca (T3N0M0, IIA) , celiac disease, GERD, IBS-C,  type I diabetes    GERD with small hiatal hernia. Asymptomatic on daily PPI --Continue daily PPI. Rx for 90 days with 4 refills given -- Antireflux measures reviewed  History of rectal cancer s/p neoadjuvant chemo XRT followed by LAR, wedge resection of segment 4 liver nodule (Bx- [facility]) with loop ileostomy 05/2008 followed by takedown  11/2008  Colon polyps.  A small sessile serrated polyp and a small tubular adenoma were removed at time of last colonoscopy May 2022.   -3 year follow-up colonoscopy due May 2025   DM type I and celiac disease.    HPI   Brief GI history Patient has right hip history of rectal cancer as described above.  She was last seen in the office April 2022 for evaluation of IBS-C.  She was scheduled for a colonoscopy, labs and a CT scan. CT scan unrevealing.  Celiac studies were positive, CEA was mildly elevated. EGD was added on her colonoscopy. EGD with small bowel biopsies + celiac. Two colon polyps removed ( see full reports below)   INTERVAL HISTORY    Chief complaint : Needs refill on GERD medication  Pailey has a history of GERD.  She is asymptomatic on daily pantoprazole.  She is in need of refills.  She has no GI complaints.  She tries to avoid gluten in her diet otherwise she will get diarrhea.  Bowel movements are normal.  No blood in stool.    Previous GI Endoscopies / Labs / Imaging   **May not include all endoscopic evaluations   EGD  and colonoscopy May 2022 for evaluation of positive celiac studies / constipation - Small hiatal hernia. - Gastritis. Biopsied. - Scalloped mucosa was found in the duodenum, suspicious for celiac disease. Biopsied  Colonoscopy - Prep adequate to identify polyps.  2 cm of the ileum examined. 6 mm mid ascending colon polyp removed .  2 sessile polyps ranging 2 to 4  mm in size were removed from the proximal transverse colon .  Mild sigmoid diverticulosis - Patent end-to-end colo-colonic anastomosis, characterized by healthy appearing mucosa with minor degree of anastomotic stenosis. No recurrence. - Non-bleeding internal hemorrhoids. - The examined portion of the ileum was normal. - The examination was otherwise normal on direct and retroflexion views  Diagnosis 1. Surgical [P], small bowel - SMALL INTESTINAL MUCOSA WITH SUBTOTAL VILLOUS ATROPHY, CHRONIC INFLAMMATION, CRYPT HYPERPLASIA, AND INTRAEPITHELIAL LYMPHOCYTOSIS, CONSISTENT WITH CELIAC DISEASE IN AN APPROPRIATE CLINICAL SETTING (MARSH 3B TYPE LESION) 2. Surgical [P], gastric body - GASTRIC OXYNTIC MUCOSA WITH NONSPECIFIC REACTIVE GASTROPATHY - WARTHIN STARRY STAIN IS NEGATIVE FOR HELICOBACTER PYLORI 3. Surgical [P], colon, ascending and transverse, polyp (3) - TUBULAR ADENOMA WITHOUT HIGH-GRADE DYSPLASIA OR MALIGNANCY - SESSILE SERRATED POLYP WITHOUT CYTOLOGIC DYSPLASIA - HYPERPLASTIC POLYP      Latest Ref Rng & Units 07/31/2020   11:58 AM  Hepatic Function  Total Protein 6.0 - 8.3 g/dL 6.5   Albumin 3.5 - 5.2 g/dL 4.0   AST 0 - 37 U/L 20   ALT 0 - 35 U/L 19   Alk Phosphatase 39 - 117 U/L 118   Total Bilirubin 0.2 - 1.2 mg/dL 0.4        Latest Ref Rng & Units 07/31/2020   11:58 AM  CBC  WBC 4.0 - 10.5 K/uL 6.5   Hemoglobin  12.0 - 15.0 g/dL 16.1   Hematocrit 09.6 - 46.0 % 46.4   Platelets 150.0 - 400.0 K/uL 197.0      Past Medical History:  Diagnosis Date   Diabetes mellitus (HCC) 1998   type 1 on insulin pump   History of rectal cancer    IBS (irritable bowel syndrome)    Rectal cancer (HCC) 2009   chemo and radiation     Past Surgical History:  Procedure Laterality Date   COLONOSCOPY  02/13/2016   Mild anastomotic stenosis. Otherwise normal colonoscopy to terminal ileum   COLONOSCOPY  08/17/2020   rectal bleeding - Rhonda [facility]   LAR with loop ileostomy  05/09/2008    [facility] Low anterior resection with temporary loop ileostomy and wedge excision of segment 4 liver nodule.   UPPER GASTROINTESTINAL ENDOSCOPY      Family History  Problem Relation Age of Onset   Multiple sclerosis Mother    Heart failure Father    Colon cancer Neg Hx    Colon polyps Neg Hx    Esophageal cancer Neg Hx    Stomach cancer Neg Hx    Rectal cancer Neg Hx     Current Medications, Allergies, Family History and Social History were reviewed in [employer] record.     Current Outpatient Medications  Medication Sig Dispense Refill   EMGALITY 120 MG/ML SOAJ Inject 120 mg into the skin once.     insulin aspart (NOVOLOG) 100 UNIT/ML injection Inject 50 Units into the skin 3 (three) times daily before meals. Via insullin pump     pantoprazole (PROTONIX) 40 MG tablet Take 1 tablet (40 mg total) by mouth daily. Please call [phone] to schedule an office visit for more refills 90 tablet 1   SUMAtriptan (IMITREX) 100 MG tablet Take 100 mg by mouth 2 (two) times daily as needed.     traZODone (DESYREL) 100 MG tablet Take 200 mg by mouth at bedtime.     No current facility-administered medications for this visit.    Review of Systems: No chest pain. No shortness of breath. No urinary complaints.    Physical Exam  Wt Readings from Last 3 Encounters:  08/17/20 142 lb (64.4 kg)  07/31/20 142 lb (64.4 kg)    BP (!) 96/50 (BP Location: Left Arm, Patient Position: Sitting, Cuff Size: Normal)   Pulse 72   Ht 5\' 1"  (1.549 m) Comment: height measured without shoes  Wt 136 lb (61.7 kg)   BMI 25.70 kg/m  Constitutional:  Pleasant, generally well appearing female in no acute distress. Psychiatric: Normal mood and affect. Behavior is normal. EENT: Pupils normal.  Conjunctivae are normal. No scleral icterus. Neck supple.  Cardiovascular: Normal rate, regular rhythm.  Pulmonary/chest: Effort normal and breath sounds normal. No wheezing, rales or  rhonchi. Abdominal: Soft, nondistended, nontender. Bowel sounds active throughout. There are no masses palpable. No hepatomegaly. Neurological: Alert and oriented to person place and time.    Willette Cluster, NP  11/25/2022, 8:31 AM

## 2022-11-25 NOTE — Patient Instructions (Addendum)
_______________________________________________________  If your blood pressure at your visit was 140/90 or greater, please contact your primary care physician to follow up on this. _______________________________________________________  If you are age 53 or older, your body mass index should be between 23-30. Your Body mass index is 25.7 kg/m. If this is out of the aforementioned range listed, please consider follow up with your Primary Care Provider. ________________________________________________________  The [facility] providers would like to encourage you to use [facility] to communicate with providers for non-urgent requests or questions.  Due to long hold times on the telephone, sending your provider a message by [facility] may be a faster and more efficient way to get a response.  Please allow 48 business hours for a response.  Please remember that this is for non-urgent requests.  _______________________________________________________  We have sent the following medications to your pharmacy for you to pick up at your convenience:  CONTINUE: pantoprazole 40 mg one tablet every morning  Thank you for entrusting me with your care and choosing [facility].  Willette Cluster, NP

## 2022-11-27 NOTE — Progress Notes (Signed)
Agree with assessment/plan.  Raj Gupta, MD [facility] [phone]  

## 2023-01-06 DIAGNOSIS — I10 Essential (primary) hypertension: Secondary | ICD-10-CM | POA: Diagnosis not present

## 2023-01-06 DIAGNOSIS — G43909 Migraine, unspecified, not intractable, without status migrainosus: Secondary | ICD-10-CM | POA: Diagnosis not present

## 2023-01-06 DIAGNOSIS — E109 Type 1 diabetes mellitus without complications: Secondary | ICD-10-CM | POA: Diagnosis not present

## 2023-01-06 DIAGNOSIS — Z6824 Body mass index (BMI) 24.0-24.9, adult: Secondary | ICD-10-CM | POA: Diagnosis not present

## 2023-04-14 DIAGNOSIS — E109 Type 1 diabetes mellitus without complications: Secondary | ICD-10-CM | POA: Diagnosis not present

## 2023-04-14 DIAGNOSIS — Z23 Encounter for immunization: Secondary | ICD-10-CM | POA: Diagnosis not present

## 2023-04-14 DIAGNOSIS — K900 Celiac disease: Secondary | ICD-10-CM | POA: Diagnosis not present

## 2023-04-14 DIAGNOSIS — E559 Vitamin D deficiency, unspecified: Secondary | ICD-10-CM | POA: Diagnosis not present

## 2023-04-28 DIAGNOSIS — Z6824 Body mass index (BMI) 24.0-24.9, adult: Secondary | ICD-10-CM | POA: Diagnosis not present

## 2023-04-28 DIAGNOSIS — E7800 Pure hypercholesterolemia, unspecified: Secondary | ICD-10-CM | POA: Diagnosis not present

## 2023-04-28 DIAGNOSIS — Z1339 Encounter for screening examination for other mental health and behavioral disorders: Secondary | ICD-10-CM | POA: Diagnosis not present

## 2023-04-28 DIAGNOSIS — E109 Type 1 diabetes mellitus without complications: Secondary | ICD-10-CM | POA: Diagnosis not present

## 2023-04-28 DIAGNOSIS — Z1331 Encounter for screening for depression: Secondary | ICD-10-CM | POA: Diagnosis not present

## 2023-04-30 IMAGING — CT CT ABD-PELV W/ CM
2 of 5 series · 16 of 46 positions shown, 18 images · IV contrast (Omnipaque)
Comparison: 09/20/2013.

CLINICAL DATA: Rectal cancer. Status post low anterior resection
with loop ileostomy. Constipation.

EXAM:
CT ABDOMEN AND PELVIS WITH CONTRAST
TECHNIQUE: Multidetector CT imaging of the abdomen and pelvis was performed
using the standard protocol following bolus administration of
intravenous contrast.
CONTRAST:  100mL OMNIPAQUE IOHEXOL 300 MG/ML  SOLN

[Series 2: axial st · axial · 0.77mm/px · z∈[-464,-89]mm · 13 of 85 slices shown, 15 images]
[im 5/85  soft-tissue]
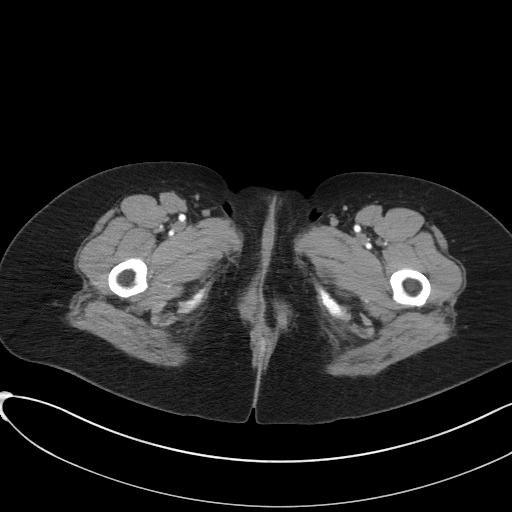
[im 5/85  bone]
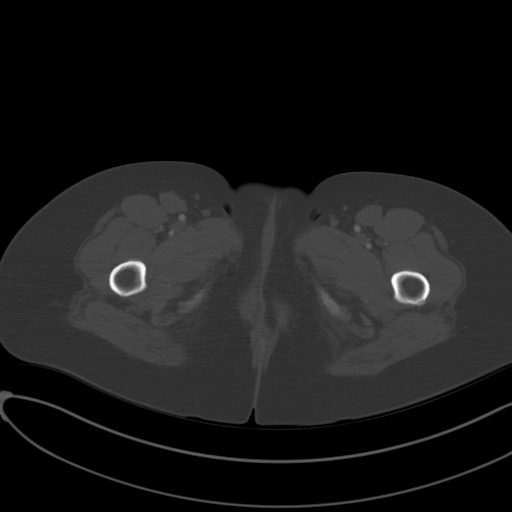
[im 13/85  soft-tissue]
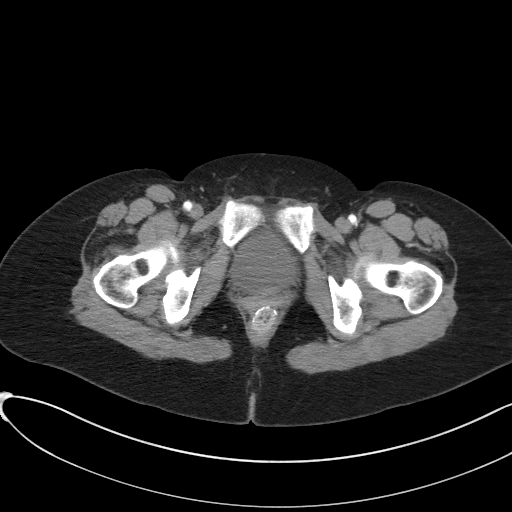
[im 17/85  soft-tissue]
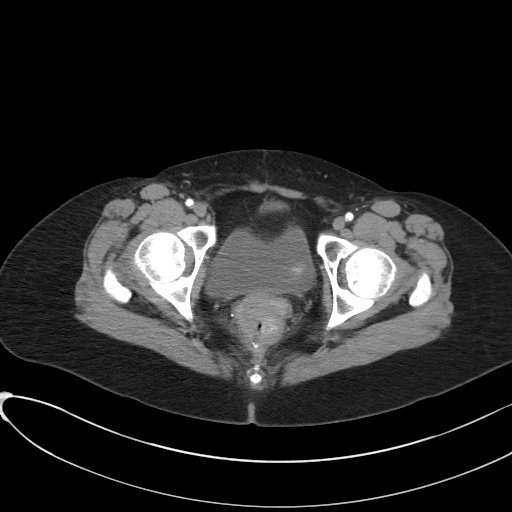
[im 26/85  soft-tissue]
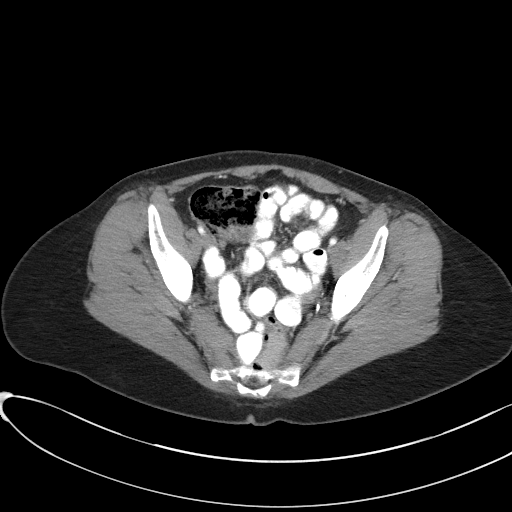
[im 30/85  soft-tissue]
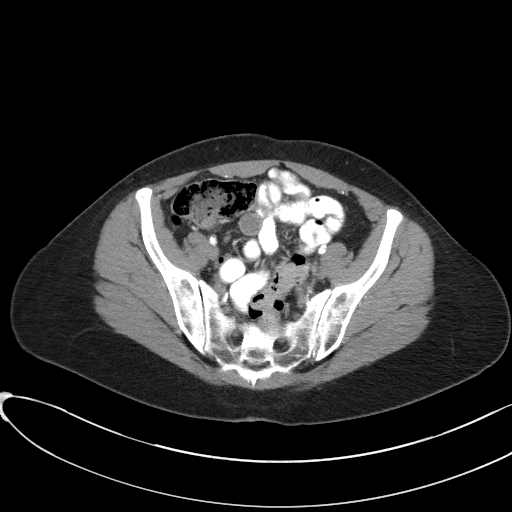
[im 38/85  soft-tissue]
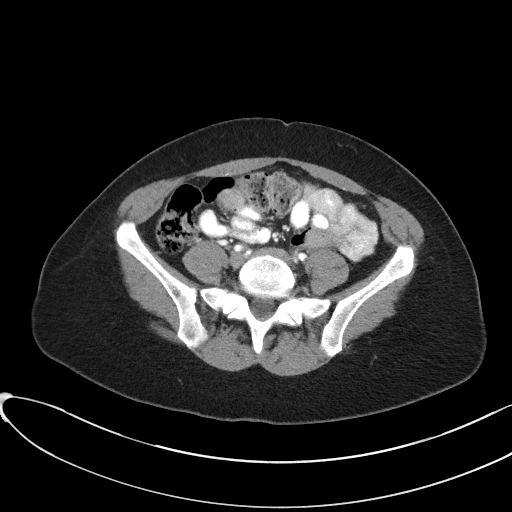
[im 43/85  soft-tissue]
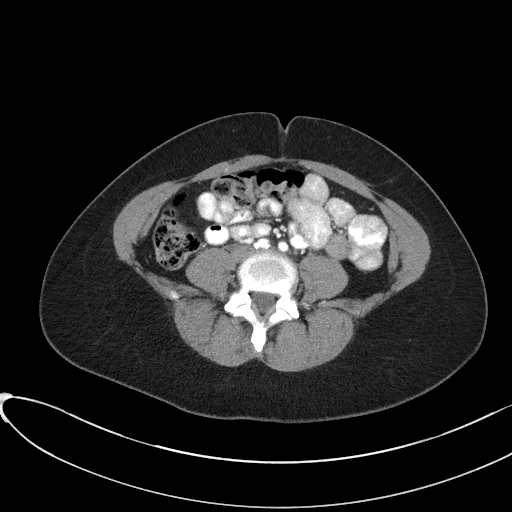
[im 47/85  soft-tissue]
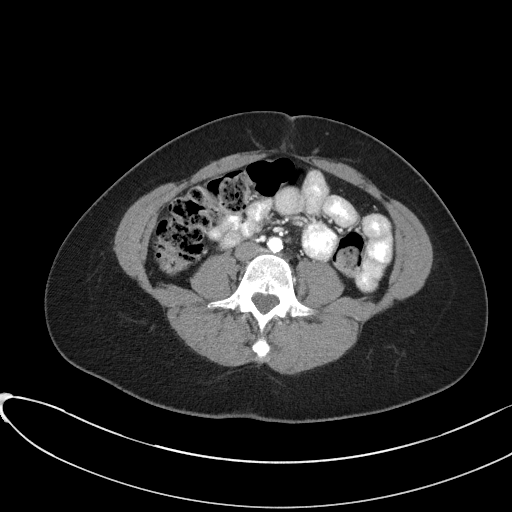
[im 55/85  soft-tissue]
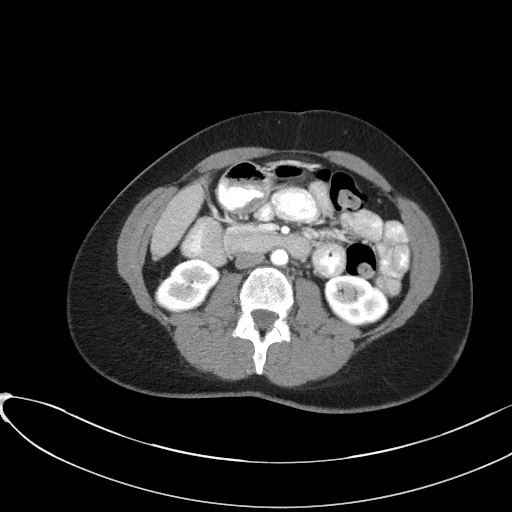
[im 55/85  bone]
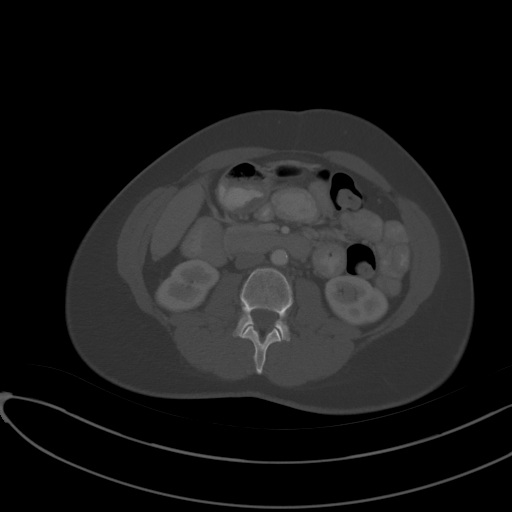
[im 59/85  soft-tissue]
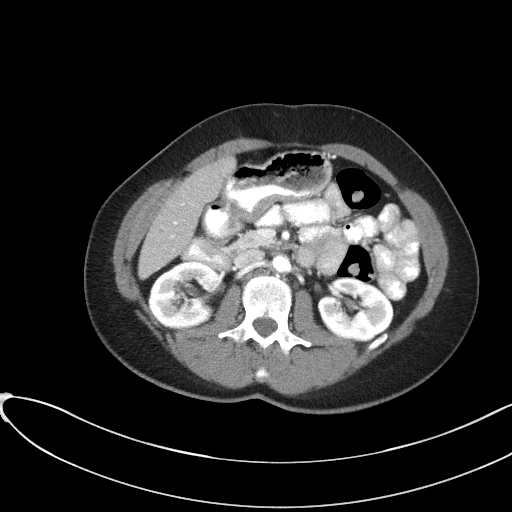
[im 68/85  soft-tissue]
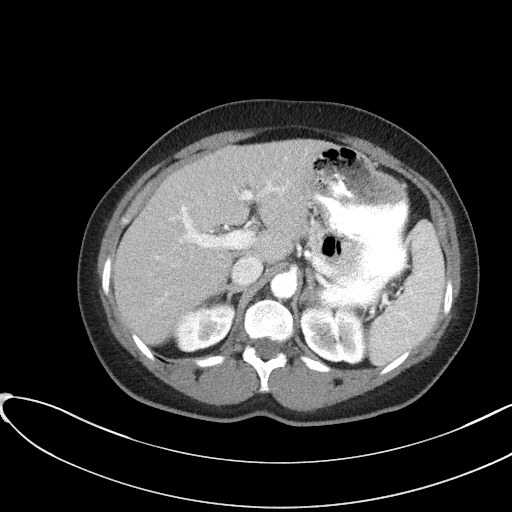
[im 72/85  soft-tissue]
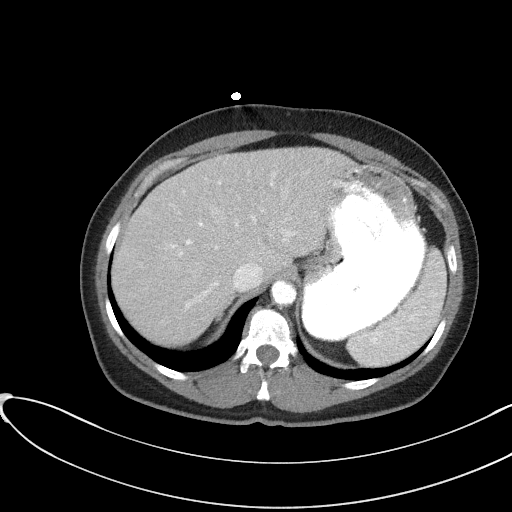
[im 80/85  soft-tissue]
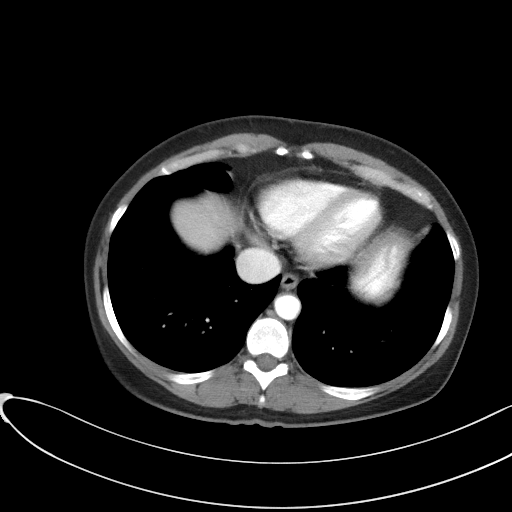

[Series 5: coronal st · coronal · 0.75mm/px · 3 of 84 slices shown]
[im 28/84  soft-tissue]
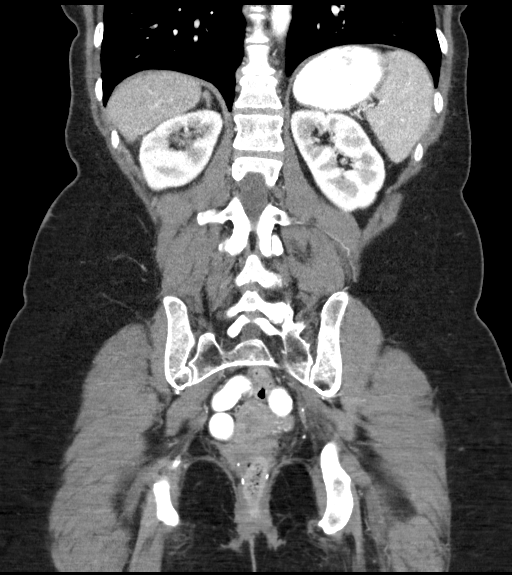
[im 37/84  soft-tissue]
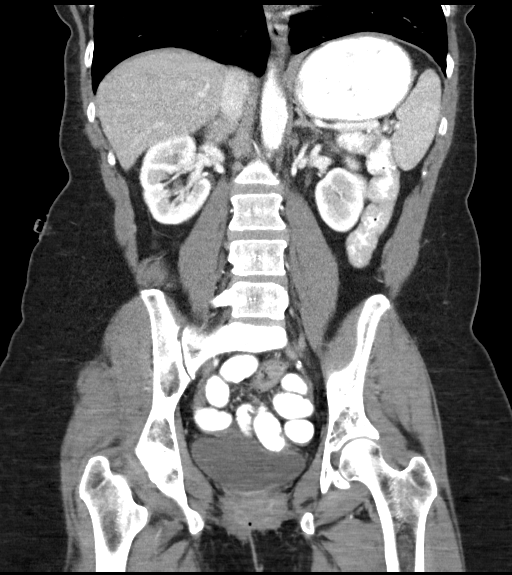
[im 47/84  soft-tissue]
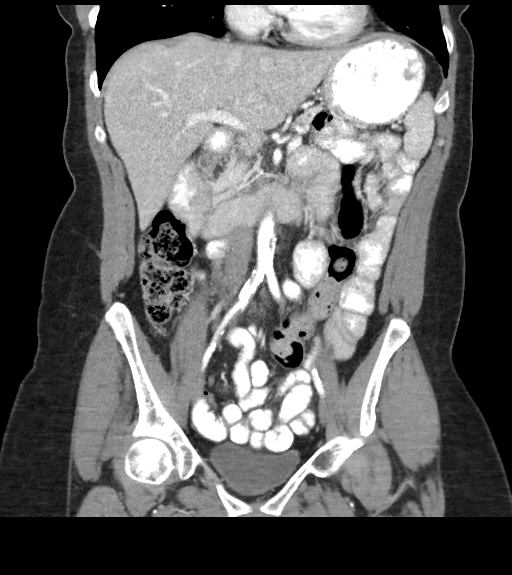

[16 of 46 positions shown; findings below may reference images not displayed]

FINDINGS: Lower chest: Unremarkable.

Hepatobiliary: No suspicious focal abnormality within the liver
parenchyma. Tiny subcapsular hypodensity in the inferior right liver
(image [DATE]) is stable. Gallbladder is decompressed. No intrahepatic
or extrahepatic biliary dilation.

Pancreas: No focal mass lesion. No dilatation of the main duct. No
intraparenchymal cyst. No peripancreatic edema.

Spleen: No splenomegaly. No focal mass lesion.

Adrenals/Urinary Tract: No adrenal nodule or mass. Kidneys
unremarkable. No evidence for hydroureter. The urinary bladder
appears normal for the degree of distention.

Stomach/Bowel: Stomach is unremarkable. No gastric wall thickening.
No evidence of outlet obstruction. Duodenum is normally positioned
as is the ligament of Treitz. No small bowel wall thickening. No
small bowel dilatation. The terminal ileum is normal. The appendix
is normal. No gross colonic mass. No colonic wall thickening. Low
rectal anastomosis evident. No substantial stool volume.

Vascular/Lymphatic: There is abdominal aortic atherosclerosis
without aneurysm. There is no gastrohepatic or hepatoduodenal
ligament lymphadenopathy. No retroperitoneal or mesenteric
lymphadenopathy. No pelvic sidewall lymphadenopathy.

Reproductive: Unremarkable.

Other: No intraperitoneal free fluid.

Musculoskeletal: No worrisome lytic or sclerotic osseous
abnormality.
IMPRESSION: 1. No evidence for recurrent or metastatic disease on the current
study.
2. Status post low rectal anastomosis.
3. Aortic Atherosclerosis ([id number]).

## 2023-05-06 DIAGNOSIS — M65311 Trigger thumb, right thumb: Secondary | ICD-10-CM | POA: Diagnosis not present

## 2023-06-03 DIAGNOSIS — M65311 Trigger thumb, right thumb: Secondary | ICD-10-CM | POA: Diagnosis not present

## 2023-10-13 DIAGNOSIS — E559 Vitamin D deficiency, unspecified: Secondary | ICD-10-CM | POA: Diagnosis not present

## 2023-10-13 DIAGNOSIS — E109 Type 1 diabetes mellitus without complications: Secondary | ICD-10-CM | POA: Diagnosis not present

## 2023-10-13 DIAGNOSIS — E10649 Type 1 diabetes mellitus with hypoglycemia without coma: Secondary | ICD-10-CM | POA: Diagnosis not present

## 2023-10-13 DIAGNOSIS — F17200 Nicotine dependence, unspecified, uncomplicated: Secondary | ICD-10-CM | POA: Diagnosis not present

## 2023-12-05 ENCOUNTER — Other Ambulatory Visit: Payer: Self-pay | Admitting: Nurse Practitioner

## 2023-12-05 DIAGNOSIS — Z794 Long term (current) use of insulin: Secondary | ICD-10-CM | POA: Diagnosis not present

## 2023-12-05 DIAGNOSIS — E109 Type 1 diabetes mellitus without complications: Secondary | ICD-10-CM | POA: Diagnosis not present

## 2023-12-05 DIAGNOSIS — Z932 Ileostomy status: Secondary | ICD-10-CM | POA: Diagnosis not present

## 2024-01-02 DIAGNOSIS — Z794 Long term (current) use of insulin: Secondary | ICD-10-CM | POA: Diagnosis not present

## 2024-01-02 DIAGNOSIS — Z932 Ileostomy status: Secondary | ICD-10-CM | POA: Diagnosis not present

## 2024-01-02 DIAGNOSIS — E109 Type 1 diabetes mellitus without complications: Secondary | ICD-10-CM | POA: Diagnosis not present

## 2024-01-19 DIAGNOSIS — M65311 Trigger thumb, right thumb: Secondary | ICD-10-CM | POA: Diagnosis not present

## 2024-01-21 ENCOUNTER — Other Ambulatory Visit: Payer: Self-pay | Admitting: Orthopedic Surgery

## 2024-02-12 ENCOUNTER — Other Ambulatory Visit: Payer: Self-pay

## 2024-02-12 ENCOUNTER — Encounter (HOSPITAL_BASED_OUTPATIENT_CLINIC_OR_DEPARTMENT_OTHER): Payer: Self-pay | Admitting: Orthopedic Surgery

## 2024-02-16 ENCOUNTER — Encounter (HOSPITAL_BASED_OUTPATIENT_CLINIC_OR_DEPARTMENT_OTHER)
Admission: RE | Admit: 2024-02-16 | Discharge: 2024-02-16 | Disposition: A | Source: Ambulatory Visit | Attending: Orthopedic Surgery

## 2024-02-16 DIAGNOSIS — M65311 Trigger thumb, right thumb: Secondary | ICD-10-CM | POA: Diagnosis present

## 2024-02-16 DIAGNOSIS — F17210 Nicotine dependence, cigarettes, uncomplicated: Secondary | ICD-10-CM | POA: Diagnosis not present

## 2024-02-16 DIAGNOSIS — K219 Gastro-esophageal reflux disease without esophagitis: Secondary | ICD-10-CM | POA: Diagnosis not present

## 2024-02-16 DIAGNOSIS — Z01818 Encounter for other preprocedural examination: Secondary | ICD-10-CM | POA: Insufficient documentation

## 2024-02-16 DIAGNOSIS — I10 Essential (primary) hypertension: Secondary | ICD-10-CM | POA: Diagnosis not present

## 2024-02-16 DIAGNOSIS — Z794 Long term (current) use of insulin: Secondary | ICD-10-CM | POA: Diagnosis not present

## 2024-02-16 DIAGNOSIS — E109 Type 1 diabetes mellitus without complications: Secondary | ICD-10-CM | POA: Diagnosis not present

## 2024-02-16 DIAGNOSIS — Z6825 Body mass index (BMI) 25.0-25.9, adult: Secondary | ICD-10-CM | POA: Diagnosis not present

## 2024-02-16 DIAGNOSIS — E7800 Pure hypercholesterolemia, unspecified: Secondary | ICD-10-CM | POA: Diagnosis not present

## 2024-02-16 DIAGNOSIS — Z23 Encounter for immunization: Secondary | ICD-10-CM | POA: Diagnosis not present

## 2024-02-16 DIAGNOSIS — Z79899 Other long term (current) drug therapy: Secondary | ICD-10-CM | POA: Diagnosis not present

## 2024-02-16 LAB — BASIC METABOLIC PANEL WITH GFR
Anion gap: 12 (ref 5–15)
BUN: 11 mg/dL (ref 6–20)
CO2: 25 mmol/L (ref 22–32)
Calcium: 9.2 mg/dL (ref 8.9–10.3)
Chloride: 102 mmol/L (ref 98–111)
Creatinine, Ser: 0.75 mg/dL (ref 0.44–1.00)
GFR, Estimated: >60 mL/min (ref >60)
Glucose, Bld: 181 mg/dL — ABNORMAL HIGH (ref 70–99)
Potassium: 4.5 mmol/L (ref 3.5–5.1)
Sodium: 139 mmol/L (ref 135–145)

## 2024-02-16 NOTE — Progress Notes (Signed)

## 2024-02-18 NOTE — Anesthesia Preprocedure Evaluation (Addendum)
 Anesthesia Evaluation  Patient identified by MRN, date of birth, ID band Patient awake    Reviewed: Allergy & Precautions, NPO status , Patient's Chart, lab work & pertinent test results  History of Anesthesia Complications Negative for: history of anesthetic complications  Airway Mallampati: II  TM Distance: >3 FB Neck ROM: Full    Dental no notable dental hx.    Pulmonary Current Smoker   Pulmonary exam normal        Cardiovascular hypertension, Pt. on medications Normal cardiovascular exam     Neuro/Psych negative neurological ROS     GI/Hepatic Neg liver ROS,GERD  Medicated,,  Endo/Other  diabetes, Type 1, Insulin  Dependent    Renal/GU negative Renal ROS     Musculoskeletal RIGHT TRIGGER THUMB   Abdominal   Peds  Hematology negative hematology ROS (+)   Anesthesia Other Findings   Reproductive/Obstetrics                              Anesthesia Physical Anesthesia Plan  ASA: 2  Anesthesia Plan: General   Post-op Pain Management: Tylenol PO (pre-op)*   Induction: Intravenous  PONV Risk Score and Plan: 2 and Treatment may vary due to age or medical condition, Ondansetron  and Midazolam   Airway Management Planned: LMA  Additional Equipment: None  Intra-op Plan:   Post-operative Plan:   Informed Consent: I have reviewed the patients History and Physical, chart, labs and discussed the procedure including the risks, benefits and alternatives for the proposed anesthesia with the patient or authorized representative who has indicated his/her understanding and acceptance.     Dental advisory given  Plan Discussed with: CRNA  Anesthesia Plan Comments:          Anesthesia Quick Evaluation

## 2024-02-19 ENCOUNTER — Other Ambulatory Visit: Payer: Self-pay

## 2024-02-19 ENCOUNTER — Ambulatory Visit (HOSPITAL_BASED_OUTPATIENT_CLINIC_OR_DEPARTMENT_OTHER)
Admission: RE | Admit: 2024-02-19 | Discharge: 2024-02-19 | Disposition: A | Discharge status: Home / Self Care | Attending: Orthopedic Surgery | Admitting: Orthopedic Surgery

## 2024-02-19 ENCOUNTER — Ambulatory Visit (HOSPITAL_BASED_OUTPATIENT_CLINIC_OR_DEPARTMENT_OTHER): Admitting: Anesthesiology

## 2024-02-19 ENCOUNTER — Encounter (HOSPITAL_BASED_OUTPATIENT_CLINIC_OR_DEPARTMENT_OTHER)
Admission: RE | Disposition: A | Discharge status: Home / Self Care | Payer: Self-pay | Source: Home / Self Care | Attending: Orthopedic Surgery

## 2024-02-19 ENCOUNTER — Encounter (HOSPITAL_BASED_OUTPATIENT_CLINIC_OR_DEPARTMENT_OTHER): Payer: Self-pay | Admitting: Orthopedic Surgery

## 2024-02-19 DIAGNOSIS — Z79899 Other long term (current) drug therapy: Secondary | ICD-10-CM | POA: Diagnosis not present

## 2024-02-19 DIAGNOSIS — F17210 Nicotine dependence, cigarettes, uncomplicated: Secondary | ICD-10-CM | POA: Insufficient documentation

## 2024-02-19 DIAGNOSIS — I10 Essential (primary) hypertension: Secondary | ICD-10-CM | POA: Diagnosis not present

## 2024-02-19 DIAGNOSIS — E109 Type 1 diabetes mellitus without complications: Secondary | ICD-10-CM | POA: Insufficient documentation

## 2024-02-19 DIAGNOSIS — K219 Gastro-esophageal reflux disease without esophagitis: Secondary | ICD-10-CM | POA: Diagnosis not present

## 2024-02-19 DIAGNOSIS — Z794 Long term (current) use of insulin: Secondary | ICD-10-CM | POA: Diagnosis not present

## 2024-02-19 DIAGNOSIS — M65311 Trigger thumb, right thumb: Secondary | ICD-10-CM | POA: Insufficient documentation

## 2024-02-19 DIAGNOSIS — Z01818 Encounter for other preprocedural examination: Secondary | ICD-10-CM

## 2024-02-19 HISTORY — PX: TRIGGER FINGER RELEASE: SHX641

## 2024-02-19 HISTORY — DX: Essential (primary) hypertension: I10

## 2024-02-19 LAB — GLUCOSE, CAPILLARY
Glucose-Capillary: 179 mg/dL — ABNORMAL HIGH (ref 70–99)
Glucose-Capillary: 231 mg/dL — ABNORMAL HIGH (ref 70–99)

## 2024-02-19 SURGERY — RELEASE, A1 PULLEY, FOR TRIGGER FINGER
Anesthesia: General | Site: Thumb | Laterality: Right

## 2024-02-19 MED ORDER — PROPOFOL 10 MG/ML IV BOLUS
INTRAVENOUS | Status: AC
Start: 2024-02-19 — End: 2024-02-19
  Filled 2024-02-19: qty 20

## 2024-02-19 MED ORDER — HYDROCODONE-ACETAMINOPHEN 5-325 MG PO TABS: 1.0000 | ORAL_TABLET | Freq: Four times a day (QID) | ORAL | 0 refills | Status: AC | PRN

## 2024-02-19 MED ORDER — MIDAZOLAM HCL 2 MG/2ML IJ SOLN
INTRAMUSCULAR | Status: AC
  Filled 2024-02-19: qty 2

## 2024-02-19 MED ORDER — EPHEDRINE SULFATE (PRESSORS) 25 MG/5ML IV SOSY
PREFILLED_SYRINGE | INTRAVENOUS | Status: DC | PRN
  Administered 2024-02-19 (×2): 10 mg via INTRAVENOUS

## 2024-02-19 MED ORDER — PROPOFOL 10 MG/ML IV BOLUS
INTRAVENOUS | Status: DC | PRN
  Administered 2024-02-19: 120 mg via INTRAVENOUS

## 2024-02-19 MED ORDER — CEFAZOLIN SODIUM-DEXTROSE 2-4 GM/100ML-% IV SOLN: 2.0000 g | INTRAVENOUS | Status: DC

## 2024-02-19 MED ORDER — 0.9 % SODIUM CHLORIDE (POUR BTL) OPTIME
TOPICAL | Status: DC | PRN
  Administered 2024-02-19: 1000 mL

## 2024-02-19 MED ORDER — PROPOFOL 10 MG/ML IV BOLUS
INTRAVENOUS | Status: AC
  Filled 2024-02-19: qty 20

## 2024-02-19 MED ORDER — DROPERIDOL 2.5 MG/ML IJ SOLN
0.6250 mg | Freq: Once | INTRAMUSCULAR | Status: AC | PRN
  Administered 2024-02-19: 0.625 mg via INTRAVENOUS

## 2024-02-19 MED ORDER — BUPIVACAINE HCL (PF) 0.25 % IJ SOLN
INTRAMUSCULAR | Status: DC | PRN
  Administered 2024-02-19: 9 mL

## 2024-02-19 MED ORDER — MIDAZOLAM HCL (PF) 2 MG/2ML IJ SOLN
INTRAMUSCULAR | Status: DC | PRN
  Administered 2024-02-19: 2 mg via INTRAVENOUS

## 2024-02-19 MED ORDER — OXYCODONE HCL 5 MG/5ML PO SOLN: 5.0000 mg | Freq: Once | ORAL | Status: DC | PRN

## 2024-02-19 MED ORDER — LACTATED RINGERS IV SOLN: INTRAVENOUS | Status: DC

## 2024-02-19 MED ORDER — BUPIVACAINE HCL (PF) 0.25 % IJ SOLN
INTRAMUSCULAR | Status: AC
  Filled 2024-02-19: qty 120

## 2024-02-19 MED ORDER — ACETAMINOPHEN 500 MG PO TABS
ORAL_TABLET | ORAL | Status: AC
  Filled 2024-02-19: qty 2

## 2024-02-19 MED ORDER — FENTANYL CITRATE (PF) 100 MCG/2ML IJ SOLN: 25.0000 ug | INTRAMUSCULAR | Status: DC | PRN

## 2024-02-19 MED ORDER — CEFAZOLIN SODIUM-DEXTROSE 2-4 GM/100ML-% IV SOLN
INTRAVENOUS | Status: AC
  Filled 2024-02-19: qty 100

## 2024-02-19 MED ORDER — LIDOCAINE HCL (PF) 1 % IJ SOLN
INTRAMUSCULAR | Status: AC
Start: 2024-02-19 — End: 2024-02-19
  Filled 2024-02-19: qty 5

## 2024-02-19 MED ORDER — FENTANYL CITRATE (PF) 100 MCG/2ML IJ SOLN
INTRAMUSCULAR | Status: DC | PRN
  Administered 2024-02-19: 100 ug via INTRAVENOUS

## 2024-02-19 MED ORDER — LIDOCAINE 2% (20 MG/ML) 5 ML SYRINGE
INTRAMUSCULAR | Status: AC
  Filled 2024-02-19: qty 5

## 2024-02-19 MED ORDER — ACETAMINOPHEN 500 MG PO TABS
1000.0000 mg | ORAL_TABLET | Freq: Once | ORAL | Status: AC
  Administered 2024-02-19: 1000 mg via ORAL

## 2024-02-19 MED ORDER — LIDOCAINE HCL (CARDIAC) PF 100 MG/5ML IV SOSY
PREFILLED_SYRINGE | INTRAVENOUS | Status: DC | PRN
  Administered 2024-02-19: 60 mg via INTRAVENOUS

## 2024-02-19 MED ORDER — ONDANSETRON HCL 4 MG/2ML IJ SOLN
INTRAMUSCULAR | Status: DC | PRN
  Administered 2024-02-19: 4 mg via INTRAVENOUS

## 2024-02-19 MED ORDER — OXYCODONE HCL 5 MG PO TABS: 5.0000 mg | ORAL_TABLET | Freq: Once | ORAL | Status: DC | PRN

## 2024-02-19 MED ORDER — ONDANSETRON HCL 4 MG/2ML IJ SOLN
INTRAMUSCULAR | Status: AC
  Filled 2024-02-19: qty 2

## 2024-02-19 MED ORDER — DROPERIDOL 2.5 MG/ML IJ SOLN
INTRAMUSCULAR | Status: AC
  Filled 2024-02-19: qty 2

## 2024-02-19 MED ORDER — CEFAZOLIN SODIUM-DEXTROSE 2-3 GM-%(50ML) IV SOLR
INTRAVENOUS | Status: DC | PRN
  Administered 2024-02-19: 2 g via INTRAVENOUS

## 2024-02-19 MED ORDER — FENTANYL CITRATE (PF) 100 MCG/2ML IJ SOLN
INTRAMUSCULAR | Status: AC
  Filled 2024-02-19: qty 2

## 2024-02-19 SURGICAL SUPPLY — 26 items
BLADE SURG 15 STRL LF DISP TIS (BLADE) ×2 IMPLANT
BNDG COHESIVE 2X5 TAN ST LF (GAUZE/BANDAGES/DRESSINGS) ×1 IMPLANT
BNDG COMPR ESMARK 4X3 LF (GAUZE/BANDAGES/DRESSINGS) IMPLANT
CHLORAPREP W/TINT 26 (MISCELLANEOUS) ×1 IMPLANT
CORD BIPOLAR FORCEPS 12FT (ELECTRODE) ×1 IMPLANT
COVER BACK TABLE 60X90IN (DRAPES) ×1 IMPLANT
COVER MAYO STAND STRL (DRAPES) ×1 IMPLANT
CUFF TOURN SGL QUICK 18X4 (TOURNIQUET CUFF) ×1 IMPLANT
DRAPE EXTREMITY T 121X128X90 (DISPOSABLE) ×1 IMPLANT
DRAPE SURG 17X23 STRL (DRAPES) ×1 IMPLANT
GAUZE SPONGE 4X4 12PLY STRL (GAUZE/BANDAGES/DRESSINGS) ×1 IMPLANT
GAUZE XEROFORM 1X8 LF (GAUZE/BANDAGES/DRESSINGS) ×1 IMPLANT
GLOVE BIO SURGEON STRL SZ7.5 (GLOVE) ×1 IMPLANT
GLOVE BIOGEL PI IND STRL 8 (GLOVE) ×1 IMPLANT
GOWN STRL REUS W/ TWL LRG LVL3 (GOWN DISPOSABLE) ×1 IMPLANT
GOWN STRL REUS W/TWL XL LVL3 (GOWN DISPOSABLE) ×1 IMPLANT
NDL HYPO 25X1 1.5 SAFETY (NEEDLE) ×1 IMPLANT
NEEDLE HYPO 25X1 1.5 SAFETY (NEEDLE) ×1 IMPLANT
PACK BASIN DAY SURGERY FS (CUSTOM PROCEDURE TRAY) ×1 IMPLANT
SOLN 0.9% NACL POUR BTL 1000ML (IV SOLUTION) ×1 IMPLANT
STOCKINETTE 4X48 STRL (DRAPES) ×1 IMPLANT
SUT ETHILON 4 0 PS 2 18 (SUTURE) ×1 IMPLANT
SYR BULB EAR ULCER 3OZ GRN STR (SYRINGE) ×1 IMPLANT
SYR CONTROL 10ML LL (SYRINGE) ×1 IMPLANT
TOWEL GREEN STERILE FF (TOWEL DISPOSABLE) ×2 IMPLANT
UNDERPAD 30X36 HEAVY ABSORB (UNDERPADS AND DIAPERS) ×1 IMPLANT

## 2024-02-19 NOTE — Anesthesia Postprocedure Evaluation (Signed)
 Anesthesia Post Note  Patient: Rhonda Villa  Procedure(s) Performed: RELEASE, A1 PULLEY, FOR TRIGGER FINGER (Right: Thumb)     Patient location during evaluation: PACU Anesthesia Type: General Level of consciousness: awake and alert Pain management: pain level controlled Vital Signs Assessment: post-procedure vital signs reviewed and stable Respiratory status: spontaneous breathing, nonlabored ventilation and respiratory function stable Cardiovascular status: blood pressure returned to baseline Postop Assessment: no apparent nausea or vomiting Anesthetic complications: no   No notable events documented.  Last Vitals:  Vitals:   02/19/24 0915 02/19/24 0930  BP: 123/75 132/82  Pulse: (!) 57 60  Resp: 13 16  Temp: (!) 36.4 C   SpO2: 98% 97%    Last Pain:  Vitals:   02/19/24 0930  TempSrc:   PainSc: 0-No pain                 Vertell Row

## 2024-02-19 NOTE — Anesthesia Procedure Notes (Signed)
 Procedure Name: LMA Insertion Date/Time: 02/19/2024 8:47 AM  Performed by: Burnard Rosaline HERO, CRNAPre-anesthesia Checklist: Patient identified, Emergency Drugs available, Suction available and Patient being monitored Patient Re-evaluated:Patient Re-evaluated prior to induction Oxygen Delivery Method: Circle system utilized Preoxygenation: Pre-oxygenation with 100% oxygen Induction Type: IV induction Ventilation: Mask ventilation without difficulty LMA: LMA inserted LMA Size: 4.0 Number of attempts: 1 Airway Equipment and Method: Bite block Placement Confirmation: positive ETCO2, CO2 detector and breath sounds checked- equal and bilateral Tube secured with: Tape Dental Injury: Teeth and Oropharynx as per pre-operative assessment

## 2024-02-19 NOTE — Op Note (Signed)
 02/19/2024 [facility] OPERATIVE REPORT   PREOPERATIVE DIAGNOSIS: Right trigger thumb.  POSTOPERATIVE DIAGNOSIS:  Right trigger thumb.  PROCEDURE: Right trigger thumb release.  SURGEON:  Romualdo Prosise, MD  ASSISTANT:  None.  ANESTHESIA:  General.  IV FLUIDS:  Per anesthesia flow sheet.  ESTIMATED BLOOD LOSS:  Minimal.  COMPLICATIONS:  None.  SPECIMENS:  None.  TOURNIQUET TIME:  Total Tourniquet Time Documented: Upper Arm (Right) - 9 minutes Total: Upper Arm (Right) - 9 minutes   DISPOSITION:  Stable to PACU.  LOCATION: [facility]  INDICATIONS: Rhonda Villa is a 53 y.o. female with triggering of the thumb.  This has been injected without lasting resolution.  She wishes to proceed with surgical trigger release.  Risks, benefits and alternatives of surgery were discussed including the risk of blood loss, infection, damage to nerves, vessels, tendons, ligaments, bone, failure of surgery, need for additional surgery, complications with wound healing, continued pain, continued triggering and need for repeat surgery.  She voiced understanding of these risks and elected to proceed.  OPERATIVE COURSE:  After being identified preoperatively by myself, the patient and I agreed upon the procedure and site of procedure.  The surgical site was marked. Surgical consent had been signed. She was given IV Ancef as preoperative antibiotic prophylaxis. She was transported to the operating room and placed on the operating room table in supine position with the Right upper extremity on an arm board. General anesthesia was induced by the anesthesiologist.  The Right upper extremity was prepped and draped in normal sterile orthopedic fashion. A surgical pause was performed between surgeons, anesthesia, and operating room staff, and all were in agreement as to the patient, procedure, and site of procedure.  Tourniquet at the proximal aspect of the extremity was inflated to  250 mmHg after exsanguination of the arm with an Esmarch bandage.  An incision was made transversely at the MP flexion crease of the thumb.  This was made through the skin only.  Spreading technique was used.  The radial and ulnar digital nerves were identified and were protected throughout the case. The flexor sheath was identified.  The A1 pulley was identified.  It was sharply incised.  It was released in its entirety.  Care was taken to avoid any release of the oblique pulley. The thumb was placed through a range of motion, there was noted to be no catching.  The wound was copiously irrigated with sterile saline. It was then closed with 4-0 nylon in a horizontal mattress fashion.  The wound was injected with  0.25% plain Marcaine to aid in postoperative analgesia.  It was then dressed with sterile Xeroform, 4x4s, and wrapped lightly with a Coban dressing.  Tourniquet was deflated at 9 minutes.  The fingertips were pink with brisk capillary refill after deflation of the tourniquet.  The operative drapes were broken down and the patient was awoken from anesthesia safely.  She was transferred back to the stretcher and taken to the PACU in stable condition.   I will see her back in the office in 1 week for postoperative followup.  I will give her a prescription for Norco 5/325 1 tab PO q6 hours prn pain, dispense #15.    Rhonda Voller, MD Electronically signed, 02/19/24

## 2024-02-19 NOTE — H&P (Signed)
 Rhonda Villa is an 53 y.o. female.   Chief Complaint: trigger thumb HPI: 53 y.o. yo female with triggering of right thumb.  This has been injected without lasting resolution. She wishes to proceed with surgical trigger release.   Allergies: No Known Allergies  Past Medical History:  Diagnosis Date   Diabetes mellitus (HCC) 1998   type 1 on insulin  pump   History of rectal cancer    Hypertension    IBS (irritable bowel syndrome)    Rectal cancer (HCC) 2009   chemo and radiation     Past Surgical History:  Procedure Laterality Date   COLONOSCOPY  02/13/2016   Mild anastomotic stenosis. Otherwise normal colonoscopy to terminal ileum   COLONOSCOPY  08/17/2020   rectal bleeding - [name]   LAR with loop ileostomy  05/09/2008   [facility] Low anterior resection with temporary loop ileostomy and wedge excision of segment 4 liver nodule.   UPPER GASTROINTESTINAL ENDOSCOPY      Family History: Family History  Problem Relation Age of Onset   Multiple sclerosis Mother    Heart failure Father    Colon cancer Neg Hx    Colon polyps Neg Hx    Esophageal cancer Neg Hx    Stomach cancer Neg Hx    Rectal cancer Neg Hx     Social History:   reports that she has been smoking cigarettes. She has never used smokeless tobacco. She reports current alcohol use. She reports that she does not currently use drugs.  Medications: Medications Prior to Admission  Medication Sig Dispense Refill   insulin  lispro (HUMALOG) 100 UNIT/ML injection Basal and bolus doses     olmesartan (BENICAR) 5 MG tablet Take 5 mg by mouth daily.     pantoprazole  (PROTONIX ) 40 MG tablet TAKE 1 TABLET BY MOUTH EVERY DAY 90 tablet 0   SUMAtriptan (IMITREX) 100 MG tablet Take 100 mg by mouth 2 (two) times daily as needed.     traZODone (DESYREL) 100 MG tablet Take 200 mg by mouth at bedtime.     Continuous Glucose Sensor (DEXCOM G7 SENSOR) MISC APPLY A NEW SENSOR EVERY 10 DAYS TO FREQUENTLY MONITOR BLOOD SUGARS 30 DAYS      Continuous Glucose Transmitter (DEXCOM G6 TRANSMITTER) MISC USE 1 TRANSMITTER TO MONITOR BLOOD SUGARS EVERY 3 MONTHS 90 DAYS     Insulin  Disposable Pump (OMNIPOD 5 G6 PODS, GEN 5,) MISC With Humalog      No results found for this or any previous visit (from the past 48 hours).  No results found.    Blood pressure 120/79, pulse 80, temperature 97.8 F (36.6 C), temperature source Tympanic, resp. rate 18, height 5' 1 (1.549 m), weight 62.1 kg, SpO2 97%.  General appearance: alert, cooperative, and appears stated age Head: Normocephalic, without obvious abnormality, atraumatic Neck: supple, symmetrical, trachea midline Extremities: Intact sensation and capillary refill all digits.  +epl/fpl/io.  No wounds.  Skin: Skin color, texture, turgor normal. No rashes or lesions Neurologic: Grossly normal Incision/Wound: none  Assessment/Plan Right trigger thumb.  Non operative and operative treatment options have been discussed with the patient and patient wishes to proceed with operative treatment. Risks, benefits and alternatives of surgery were discussed including risks of blood loss, infection, damage to nerves/vessels/tendons/ligament/bone, failure of surgery, need for additional surgery, complication with wound healing, stiffness, recurrence.  She voiced understanding of these risks and elected to proceed.    [name] 02/19/2024, 8:28 AM

## 2024-02-19 NOTE — Discharge Instructions (Addendum)

## 2024-02-19 NOTE — Transfer of Care (Signed)
 Immediate Anesthesia Transfer of Care Note  Patient: Rhonda Villa  Procedure(s) Performed: RELEASE, A1 PULLEY, FOR TRIGGER FINGER (Right: Thumb)  Patient Location: PACU  Anesthesia Type:General  Level of Consciousness: awake, drowsy, and patient cooperative  Airway & Oxygen Therapy: Patient Spontanous Breathing and Patient connected to face mask oxygen  Post-op Assessment: Report given to RN and Post -op Vital signs reviewed and stable  Post vital signs: Reviewed and stable  Last Vitals:  Vitals Value Taken Time  BP    Temp    Pulse 56 02/19/24 09:11  Resp 24 02/19/24 09:11  SpO2 98 % 02/19/24 09:11  Vitals shown include unfiled device data.  Last Pain:  Vitals:   02/19/24 0713  TempSrc: Tympanic  PainSc: 0-No pain      Patients Stated Pain Goal: 7 (02/19/24 0713)  Complications: No notable events documented.

## 2024-02-20 ENCOUNTER — Encounter (HOSPITAL_BASED_OUTPATIENT_CLINIC_OR_DEPARTMENT_OTHER): Payer: Self-pay | Admitting: Orthopedic Surgery

## 2024-02-27 DIAGNOSIS — M65311 Trigger thumb, right thumb: Secondary | ICD-10-CM | POA: Diagnosis not present

## 2024-03-05 DIAGNOSIS — M65311 Trigger thumb, right thumb: Secondary | ICD-10-CM | POA: Diagnosis not present

## 2024-03-07 ENCOUNTER — Other Ambulatory Visit: Payer: Self-pay | Admitting: Gastroenterology
